# Patient Record
Sex: Female | Born: 1990 | Hispanic: Yes | Marital: Married | Smoking: Never smoker
Health system: Southern US, Community
[De-identification: ages and names within clinical notes are randomized; demographics above are authoritative.]

## PROBLEM LIST (undated history)

## (undated) DIAGNOSIS — N979 Female infertility, unspecified: Secondary | ICD-10-CM

## (undated) DIAGNOSIS — E88819 Insulin resistance, unspecified: Secondary | ICD-10-CM

## (undated) DIAGNOSIS — N83209 Unspecified ovarian cyst, unspecified side: Secondary | ICD-10-CM

## (undated) DIAGNOSIS — O3480 Maternal care for other abnormalities of pelvic organs, unspecified trimester: Secondary | ICD-10-CM

## (undated) DIAGNOSIS — D649 Anemia, unspecified: Secondary | ICD-10-CM

## (undated) DIAGNOSIS — N760 Acute vaginitis: Secondary | ICD-10-CM

## (undated) DIAGNOSIS — B9689 Other specified bacterial agents as the cause of diseases classified elsewhere: Secondary | ICD-10-CM

## (undated) HISTORY — DX: Female infertility, unspecified: N97.9

## (undated) HISTORY — DX: Other specified bacterial agents as the cause of diseases classified elsewhere: N76.0

## (undated) HISTORY — PX: NO PAST SURGERIES: SHX2092

## (undated) HISTORY — DX: Other specified bacterial agents as the cause of diseases classified elsewhere: B96.89

---
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
## 2013-04-12 ENCOUNTER — Emergency Department: Payer: Self-pay | Admitting: Emergency Medicine

## 2013-04-12 LAB — URINALYSIS, COMPLETE
Bacteria: NONE SEEN
Glucose,UR: NEGATIVE mg/dL (ref 0–75)
Ketone: NEGATIVE
Leukocyte Esterase: NEGATIVE
Protein: 25
RBC,UR: 2 /HPF (ref 0–5)
Squamous Epithelial: 4
WBC UR: 2 /HPF (ref 0–5)

## 2014-02-16 ENCOUNTER — Emergency Department: Payer: Self-pay | Admitting: Emergency Medicine

## 2014-02-16 LAB — URINALYSIS, COMPLETE
Bilirubin,UR: NEGATIVE
Blood: NEGATIVE
Glucose,UR: NEGATIVE mg/dL (ref 0–75)
Ketone: NEGATIVE
Nitrite: NEGATIVE
PROTEIN: NEGATIVE
Ph: 6 (ref 4.5–8.0)
SPECIFIC GRAVITY: 1.028 (ref 1.003–1.030)
WBC UR: 10 /HPF (ref 0–5)

## 2014-02-16 LAB — COMPREHENSIVE METABOLIC PANEL
ALK PHOS: 89 U/L
ANION GAP: 5 — AB (ref 7–16)
AST: 54 U/L — AB (ref 15–37)
Albumin: 4 g/dL (ref 3.4–5.0)
BUN: 8 mg/dL (ref 7–18)
Bilirubin,Total: 0.3 mg/dL (ref 0.2–1.0)
CHLORIDE: 106 mmol/L (ref 98–107)
CREATININE: 0.96 mg/dL (ref 0.60–1.30)
Calcium, Total: 8.7 mg/dL (ref 8.5–10.1)
Co2: 27 mmol/L (ref 21–32)
EGFR (African American): >60
GLUCOSE: 85 mg/dL (ref 65–99)
Osmolality: 273 (ref 275–301)
Potassium: 3.8 mmol/L (ref 3.5–5.1)
SGPT (ALT): 72 U/L (ref 12–78)
SODIUM: 138 mmol/L (ref 136–145)
Total Protein: 7.9 g/dL (ref 6.4–8.2)

## 2014-02-16 LAB — HCG, QUANTITATIVE, PREGNANCY: Beta Hcg, Quant.: 7185 m[IU]/mL — ABNORMAL HIGH

## 2014-02-16 LAB — CBC
HCT: 37.3 % (ref 35.0–47.0)
HGB: 12.3 g/dL (ref 12.0–16.0)
MCH: 26 pg (ref 26.0–34.0)
MCHC: 32.9 g/dL (ref 32.0–36.0)
MCV: 79 fL — AB (ref 80–100)
PLATELETS: 289 10*3/uL (ref 150–440)
RBC: 4.72 10*6/uL (ref 3.80–5.20)
RDW: 15.7 % — ABNORMAL HIGH (ref 11.5–14.5)
WBC: 15.3 10*3/uL — AB (ref 3.6–11.0)

## 2014-02-24 ENCOUNTER — Emergency Department: Payer: Self-pay | Admitting: Emergency Medicine

## 2014-02-24 LAB — URINALYSIS, COMPLETE
Bacteria: NONE SEEN
Bilirubin,UR: NEGATIVE
GLUCOSE, UR: NEGATIVE mg/dL (ref 0–75)
Ketone: NEGATIVE
Nitrite: NEGATIVE
PH: 7 (ref 4.5–8.0)
PROTEIN: NEGATIVE
Specific Gravity: 1.006 (ref 1.003–1.030)
WBC UR: 5 /HPF (ref 0–5)

## 2014-02-24 LAB — CBC
HCT: 36.5 % (ref 35.0–47.0)
HGB: 12 g/dL (ref 12.0–16.0)
MCH: 26.2 pg (ref 26.0–34.0)
MCHC: 32.9 g/dL (ref 32.0–36.0)
MCV: 80 fL (ref 80–100)
Platelet: 284 10*3/uL (ref 150–440)
RBC: 4.59 10*6/uL (ref 3.80–5.20)
RDW: 16.3 % — AB (ref 11.5–14.5)
WBC: 15 10*3/uL — ABNORMAL HIGH (ref 3.6–11.0)

## 2014-02-24 LAB — HCG, QUANTITATIVE, PREGNANCY: Beta Hcg, Quant.: 49447 m[IU]/mL — ABNORMAL HIGH

## 2014-02-24 LAB — GC/CHLAMYDIA PROBE AMP

## 2014-02-24 LAB — WET PREP, GENITAL

## 2014-08-01 ENCOUNTER — Ambulatory Visit: Payer: Self-pay | Admitting: Advanced Practice Midwife

## 2014-09-19 ENCOUNTER — Observation Stay: Payer: Self-pay

## 2014-09-26 ENCOUNTER — Observation Stay: Payer: Self-pay

## 2014-10-03 ENCOUNTER — Observation Stay: Payer: Self-pay | Admitting: Obstetrics and Gynecology

## 2014-10-09 ENCOUNTER — Inpatient Hospital Stay: Payer: Self-pay | Admitting: Obstetrics and Gynecology

## 2014-12-15 LAB — SURGICAL PATHOLOGY

## 2016-07-13 ENCOUNTER — Emergency Department
Admission: EM | Admit: 2016-07-13 | Discharge: 2016-07-13 | Disposition: A | Discharge status: Home / Self Care | Payer: Self-pay | Attending: Emergency Medicine | Admitting: Emergency Medicine

## 2016-07-13 ENCOUNTER — Encounter: Payer: Self-pay | Admitting: Emergency Medicine

## 2016-07-13 DIAGNOSIS — L02212 Cutaneous abscess of back [any part, except buttock]: Secondary | ICD-10-CM | POA: Insufficient documentation

## 2016-07-13 MED ORDER — SULFAMETHOXAZOLE-TRIMETHOPRIM 800-160 MG PO TABS
1.0000 | ORAL_TABLET | Freq: Once | ORAL | Status: AC
  Administered 2016-07-13: 1 via ORAL
  Filled 2016-07-13: qty 1

## 2016-07-13 MED ORDER — LIDOCAINE-EPINEPHRINE (PF) 1 %-1:200000 IJ SOLN
30.0000 mL | Freq: Once | INTRAMUSCULAR | Status: AC
  Administered 2016-07-13: 30 mL via INTRADERMAL
  Filled 2016-07-13: qty 30

## 2016-07-13 MED ORDER — OXYCODONE-ACETAMINOPHEN 5-325 MG PO TABS
1.0000 | ORAL_TABLET | Freq: Once | ORAL | Status: AC
  Administered 2016-07-13: 1 via ORAL
  Filled 2016-07-13: qty 1

## 2016-07-13 MED ORDER — IBUPROFEN 600 MG PO TABS: 600.0000 mg | ORAL_TABLET | Freq: Four times a day (QID) | ORAL | 0 refills | Status: DC | PRN

## 2016-07-13 MED ORDER — OXYCODONE-ACETAMINOPHEN 5-325 MG PO TABS
1.0000 | ORAL_TABLET | Freq: Four times a day (QID) | ORAL | 0 refills | Status: DC | PRN
Start: 2016-07-13 — End: 2018-08-10

## 2016-07-13 MED ORDER — SULFAMETHOXAZOLE-TRIMETHOPRIM 800-160 MG PO TABS: 1.0000 | ORAL_TABLET | Freq: Two times a day (BID) | ORAL | 0 refills | Status: DC

## 2016-07-13 NOTE — ED Provider Notes (Signed)
[facility] Provider Note   CSN: [id number] Arrival date & time: 07/13/16  2137     History   Chief Complaint Chief Complaint  Patient presents with  . Abscess    HPI Lauren Stephens is a 25 y.o. female presents to the respiratory for evaluation of abscess to her back. Abscess began 6 days ago and has continually become larger and causing more pain and discomfort. She denies any fevers, numbness tingling or radicular symptoms. Her pain is moderate to severe. She recently had ibuprofen with no improvement. Her pain is increased with touch and with lying on her back. She is able to ambulate with no assisted devices.  HPI  History reviewed. No pertinent past medical history.  There are no active problems to display for this patient.   History reviewed. No pertinent surgical history.  OB History    No data available       Home Medications    Prior to Admission medications   Medication Sig Start Date End Date Taking? Authorizing Provider  ibuprofen (ADVIL,MOTRIN) 600 MG tablet Take 1 tablet (600 mg total) by mouth every 6 (six) hours as needed for moderate pain. 07/13/16   Duanne Guess, PA-C  oxyCODONE-acetaminophen (ROXICET) 5-325 MG tablet Take 1 tablet by mouth every 6 (six) hours as needed for severe pain. 07/13/16   Duanne Guess, PA-C  sulfamethoxazole-trimethoprim (BACTRIM DS,SEPTRA DS) 800-160 MG tablet Take 1 tablet by mouth 2 (two) times daily. 07/13/16   Duanne Guess, PA-C    Family History History reviewed. No pertinent family history.  Social History Social History  Substance Use Topics  . Smoking status: Never Smoker  . Smokeless tobacco: Never Used  . Alcohol use No     Allergies   Patient has no known allergies.   Review of Systems Review of Systems  Constitutional: Negative for chills and fever.  HENT: Negative for ear pain and sore throat.   Eyes: Negative for pain and visual disturbance.  Respiratory:  Negative for cough and shortness of breath.   Cardiovascular: Negative for chest pain and palpitations.  Gastrointestinal: Negative for abdominal pain and vomiting.  Genitourinary: Negative for dysuria and hematuria.  Musculoskeletal: Negative for arthralgias and back pain.  Skin: Positive for wound. Negative for color change and rash.  Neurological: Negative for seizures and syncope.  All other systems reviewed and are negative.    Physical Exam Updated Vital Signs BP (!) 142/83 (BP Location: Left Arm)   Pulse 80   Temp 98.7 F (37.1 C) (Oral)   Resp 16   Ht 5' (1.524 m)   Wt 95.3 kg   SpO2 100%   BMI 41.01 kg/m   Physical Exam  Constitutional: She is oriented to person, place, and time. She appears well-developed and well-nourished. No distress.  HENT:  Head: Normocephalic and atraumatic.  Mouth/Throat: Oropharynx is clear and moist.  Eyes: EOM are normal. Pupils are equal, round, and reactive to light. Right eye exhibits no discharge. Left eye exhibits no discharge.  Neck: Normal range of motion. Neck supple.  Cardiovascular: Normal rate, regular rhythm, normal heart sounds and intact distal pulses.   Pulmonary/Chest: Effort normal and breath sounds normal. No respiratory distress. She exhibits no tenderness.  Abdominal: Soft. She exhibits no distension. There is no tenderness.  Musculoskeletal: Normal range of motion. She exhibits no edema.  Patient has localized tenderness with induration and mild fluctuants along the upper lumbar spine along the spinous process. Area is red  with surrounding erythema approximately 6 cm in diameter. She is moderately tender to palpation. She has no other tenderness throughout the lumbar spine. She has full range of motion of the lower extremities with no discomfort ambulates and nontoxic component.  Neurological: She is alert and oriented to person, place, and time. She has normal reflexes.  Skin: Skin is warm and dry.  Psychiatric: She has  a normal mood and affect. Her behavior is normal. Thought content normal.     ED Treatments / Results  Labs (all labs ordered are listed, but only abnormal results are displayed) Labs Reviewed - No data to display  EKG  EKG Interpretation None       Radiology No results found.  Procedures Procedures (including critical care time) INCISION AND DRAINAGE Performed by: Feliberto Gottron Consent: Verbal consent obtained. Risks and benefits: risks, benefits and alternatives were discussed Type: abscess  Body area: Lower back  Anesthesia: local infiltration  Incision was made with a scalpel.  Local anesthetic: lidocaine 1 % with epinephrine  Anesthetic total: 5 ml  Complexity: complex Blunt dissection to break up loculations  Drainage: purulent  Drainage amount: 6 cc purulent drainage   Packing material: 1/4 in iodoform gauze  Patient tolerance: Patient tolerated the procedure well with no immediate complications.    Medications Ordered in ED Medications  lidocaine-EPINEPHrine (XYLOCAINE-EPINEPHrine) 1 %-1:200000 (PF) injection 30 mL (not administered)  oxyCODONE-acetaminophen (PERCOCET/ROXICET) 5-325 MG per tablet 1 tablet (1 tablet Oral Given 07/13/16 2254)  sulfamethoxazole-trimethoprim (BACTRIM DS,SEPTRA DS) 800-160 MG per tablet 1 tablet (1 tablet Oral Given 07/13/16 2254)     Initial Impression / Assessment and Plan / ED Course  I have reviewed the triage vital signs and the nursing notes.  Pertinent labs & imaging results that were available during my care of the patient were reviewed by me and considered in my medical decision making (see chart for details).  Clinical Course     25 year old female with lower back abscess, has no systemic signs of infection and no radicular symptoms. She saw improvement with incision and drainage along with packing. She will follow-up in 2-3 days for recheck, packing removal and possible wound  repacking.Return sooner for any worsening symptoms urgent changes in her health.  Final Clinical Impressions(s) / ED Diagnoses   Final diagnoses:  Abscess of back    New Prescriptions New Prescriptions   IBUPROFEN (ADVIL,MOTRIN) 600 MG TABLET    Take 1 tablet (600 mg total) by mouth every 6 (six) hours as needed for moderate pain.   OXYCODONE-ACETAMINOPHEN (ROXICET) 5-325 MG TABLET    Take 1 tablet by mouth every 6 (six) hours as needed for severe pain.   SULFAMETHOXAZOLE-TRIMETHOPRIM (BACTRIM DS,SEPTRA DS) 800-160 MG TABLET    Take 1 tablet by mouth 2 (two) times daily.     Duanne Guess, PA-C 07/13/16 2327    Nena Polio, MD 07/15/16 [phone]

## 2016-07-13 NOTE — ED Triage Notes (Signed)
Pt to triage in [facility], report abscess to mid back.

## 2016-07-13 NOTE — Discharge Instructions (Signed)
Please follow-up with primary care physician or emergency department in 2-3 days for recheck and packing removal. Take medications as prescribed. Return to the ER for any fevers, worsening symptoms or urgent changes in her health.

## 2016-07-22 ENCOUNTER — Emergency Department
Admission: EM | Admit: 2016-07-22 | Discharge: 2016-07-22 | Disposition: A | Discharge status: Home / Self Care | Payer: Self-pay | Attending: Emergency Medicine | Admitting: Emergency Medicine

## 2016-07-22 DIAGNOSIS — Z791 Long term (current) use of non-steroidal anti-inflammatories (NSAID): Secondary | ICD-10-CM | POA: Insufficient documentation

## 2016-07-22 DIAGNOSIS — Z5189 Encounter for other specified aftercare: Secondary | ICD-10-CM

## 2016-07-22 DIAGNOSIS — L02212 Cutaneous abscess of back [any part, except buttock]: Secondary | ICD-10-CM | POA: Insufficient documentation

## 2016-07-22 NOTE — ED Provider Notes (Signed)
[facility] Emergency Department Provider Note  ____________________________________________   First MD Initiated Contact with Patient 07/22/16 1240     (approximate)  I have reviewed the triage vital signs and the nursing notes.   HISTORY  Chief Complaint Wound Check   HPI Lauren Stephens is a 25 y.o. female is here for recheck of her abscessed area on her back. Patient states that she took all the antibiotics until completely finished. She states that area is not draining anymore but that her family member and she had been cleaning the area with alcohol and peroxide. She is concerned that the area is still open and is concerned that she "needs stitches". She denies any pain.   History reviewed. No pertinent past medical history.  There are no active problems to display for this patient.   History reviewed. No pertinent surgical history.  Prior to Admission medications   Medication Sig Start Date End Date Taking? Authorizing Provider  ibuprofen (ADVIL,MOTRIN) 600 MG tablet Take 1 tablet (600 mg total) by mouth every 6 (six) hours as needed for moderate pain. 07/13/16   Duanne Guess, PA-C  oxyCODONE-acetaminophen (ROXICET) 5-325 MG tablet Take 1 tablet by mouth every 6 (six) hours as needed for severe pain. 07/13/16   Duanne Guess, PA-C  sulfamethoxazole-trimethoprim (BACTRIM DS,SEPTRA DS) 800-160 MG tablet Take 1 tablet by mouth 2 (two) times daily. 07/13/16   Duanne Guess, PA-C    Allergies Patient has no known allergies.  No family history on file.  Social History Social History  Substance Use Topics  . Smoking status: Never Smoker  . Smokeless tobacco: Never Used  . Alcohol use No    Review of Systems Constitutional: No fever/chills Cardiovascular: Denies chest pain. Respiratory: Denies shortness of breath. Gastrointestinal:   No nausea, no vomiting.   Musculoskeletal: Negative for back pain. Skin:Positive for  healing abscess. Neurological: Negative for focal weakness or numbness.  10-point ROS otherwise negative.  ____________________________________________   PHYSICAL EXAM:  VITAL SIGNS: ED Triage Vitals [07/22/16 1223]  Enc Vitals Group     BP 123/66     Pulse Rate 85     Resp 16     Temp 99 F (37.2 C)     Temp Source Oral     SpO2 99 %     Weight      Height      Head Circumference      Peak Flow      Pain Score      Pain Loc      Pain Edu?      Excl. in [place]?     Constitutional: Alert and oriented. Well appearing and in no acute distress. Eyes: Conjunctivae are normal. PERRL. EOMI. Head: Atraumatic. Nose: No congestion/rhinnorhea. Neck: No stridor.   Cardiovascular: Normal rate, regular rhythm. Grossly normal heart sounds.  Good peripheral circulation. Respiratory: Normal respiratory effort.  No retractions. Lungs CTAB. Musculoskeletal: Moves upper and lower extremities without any difficulty. Normal gait was noted. Neurologic:  Normal speech and language. No gross focal neurologic deficits are appreciated. No gait instability. Skin:  Skin is warm, dry.  At the site of abscess area is still open without active drainage. No signs of infection seen. Psychiatric: Mood and affect are normal. Speech and behavior are normal.  ____________________________________________   LABS (all labs ordered are listed, but only abnormal results are displayed)  Labs Reviewed - No data to display ____________________________________________   PROCEDURES  Procedure(s)  performed: None  Procedures  Critical Care performed: No  ____________________________________________   INITIAL IMPRESSION / ASSESSMENT AND PLAN / ED COURSE  Pertinent labs & imaging results that were available during my care of the patient were reviewed by me and considered in my medical decision making (see chart for details).    Clinical Course    Patient was told to stop using alcohol and peroxide to  clean this area. She is to use mild soap and water once a day and allow area to dry. Band-Aid was placed prior to discharge. Patient is follow-up with [facility] clinic if any continued problems.  ____________________________________________   FINAL CLINICAL IMPRESSION(S) / ED DIAGNOSES  Final diagnoses:  Wound check, abscess      NEW MEDICATIONS STARTED DURING THIS VISIT:  Discharge Medication List as of 07/22/2016 12:51 PM       Note:  This document was prepared using Dragon voice recognition software and may include unintentional dictation errors.    Johnn Hai, PA-C 07/22/16 [place], MD 07/24/16 [phone]

## 2016-07-22 NOTE — ED Notes (Signed)
Bandage placed on patient's back over wound.  Patient given instructions on how to clean wound and to stop using hydrogen peroxide and alcohol.  Patient verbalized understanding of instructions and has no further questions.

## 2016-07-22 NOTE — ED Triage Notes (Signed)
Pt states she has an abscess to her back that is not draining anymore. Pt states she has a "hole" where abscess was. Concerned she needs stitches.

## 2016-07-22 NOTE — Discharge Instructions (Signed)
Follow-up with [facility] clinic if any continued problems. Clean area daily with mild soap and water. Cover with a Band-Aid but allow area to get air when at home.  Dry completely before putting next bandaid on

## 2016-11-17 LAB — HM PAP SMEAR: HM Pap smear: NEGATIVE

## 2017-08-22 NOTE — L&D Delivery Note (Signed)
Delivery Note   CNM called to room for decels, pt with rapid cervical change and BBOW with SROM copious amt clear fluid. CNM arrived in room approx 58min after phone call, examined and found to be C/C/+2, FSE applied and FHR 90s with return to baseline 110bpm between UCs.    First Stage: Labor onset: approx 0600, pt started feeling discomfort and having variable decels Induction: Cytotec x 1, Pitocin Analgesia /Anesthesia intrapartum: epidural SROM at 0625   Second Stage: Complete dilation at 0630 Onset of pushing at 0634 FHR second stage: Cat II, variable decels to 60s  Delivery of a viable female on 08/08/2018 at 0637  by Hassan Buckler CNM delivery of fetal head in LOA position with restitution to LOT, tight nuchal cord noted x 2,  Anterior then posterior shoulders delivered easily with gentle downward traction.  Baby vigorous and crying, placed on mom's chest skin-to-skin, and attended to by peds. Cord double clamped after cessation of pulsation, cut by FOB Cord blood sample collected.  Third Stage: Placenta delivered spontaneously intact with 3VC @ [phone] with large gush of bright red bleeding and clots. Fundus was firm with scant lochia.  Placenta disposition: to pathology  NO lacerations identified, intact cervix, vagina and perineum.    Est. Blood Loss (mL): 606  Complications: none  Mom to postpartum.  Baby to Couplet care / Skin to Skin.  Newborn: Birth Weight: pending  Apgar Scores: 8/9 Feeding planned: breast and formula

## 2018-01-18 ENCOUNTER — Other Ambulatory Visit: Payer: Self-pay | Admitting: Advanced Practice Midwife

## 2018-01-18 DIAGNOSIS — Z369 Encounter for antenatal screening, unspecified: Secondary | ICD-10-CM

## 2018-02-05 DIAGNOSIS — E8881 Metabolic syndrome: Secondary | ICD-10-CM | POA: Insufficient documentation

## 2018-02-08 ENCOUNTER — Encounter: Payer: Self-pay | Admitting: *Deleted

## 2018-02-08 ENCOUNTER — Ambulatory Visit
Admission: RE | Admit: 2018-02-08 | Discharge: 2018-02-08 | Disposition: A | Discharge status: Home / Self Care | Payer: Medicaid Other | Source: Ambulatory Visit | Attending: Maternal and Fetal Medicine | Admitting: Maternal and Fetal Medicine

## 2018-02-08 ENCOUNTER — Ambulatory Visit (HOSPITAL_BASED_OUTPATIENT_CLINIC_OR_DEPARTMENT_OTHER)
Admission: RE | Admit: 2018-02-08 | Discharge: 2018-02-08 | Disposition: A | Discharge status: Home / Self Care | Payer: Medicaid Other | Source: Ambulatory Visit | Attending: Maternal and Fetal Medicine | Admitting: Maternal and Fetal Medicine

## 2018-02-08 VITALS — BP 139/80 | HR 82 | Temp 98.5°F | Resp 18 | Wt 223.0 lb

## 2018-02-08 DIAGNOSIS — Z3149 Encounter for other procreative investigation and testing: Secondary | ICD-10-CM | POA: Diagnosis not present

## 2018-02-08 DIAGNOSIS — Z3A12 12 weeks gestation of pregnancy: Secondary | ICD-10-CM | POA: Diagnosis not present

## 2018-02-08 DIAGNOSIS — Z369 Encounter for antenatal screening, unspecified: Secondary | ICD-10-CM

## 2018-02-08 DIAGNOSIS — Z3689 Encounter for other specified antenatal screening: Secondary | ICD-10-CM | POA: Insufficient documentation

## 2018-02-08 NOTE — Progress Notes (Signed)
Department, [place]* Length of Consultation: 30 minute consultation   [name]  was referred to [facility] for genetic counseling to review prenatal screening and testing options.  This note summarizes the information we discussed.    We offered the following routine screening tests for this pregnancy:  First trimester screening, which includes nuchal translucency ultrasound screen and first trimester maternal serum marker screening.  The nuchal translucency has approximately an 80% detection rate for Down syndrome and can be positive for other chromosome abnormalities as well as congenital heart defects.  When combined with a maternal serum marker screening, the detection rate is up to 90% for Down syndrome and up to 97% for trisomy 18.     Maternal serum marker screening, a blood test that measures pregnancy proteins, can provide risk assessments for Down syndrome, trisomy 18, and open neural tube defects (spina bifida, anencephaly). Because it does not directly examine the fetus, it cannot positively diagnose or rule out these problems.  Targeted ultrasound uses high frequency sound waves to create an image of the developing fetus.  An ultrasound is often recommended as a routine means of evaluating the pregnancy.  It is also used to screen for fetal anatomy problems (for example, a heart defect) that might be suggestive of a chromosomal or other abnormality.   Should these screening tests indicate an increased concern, then the following additional testing options would be offered:  The chorionic villus sampling procedure is available for first trimester chromosome analysis.  This involves the withdrawal of a small amount of chorionic villi (tissue from the developing placenta).  Risk of pregnancy loss is estimated to be approximately 1 in 200 to 1 in 100 (0.5 to 1%).  There is approximately a 1% (1 in 100) chance that the CVS chromosome results will  be unclear.  Chorionic villi cannot be tested for neural tube defects.     Amniocentesis involves the removal of a small amount of amniotic fluid from the sac surrounding the fetus with the use of a thin needle inserted through the maternal abdomen and uterus.  Ultrasound guidance is used throughout the procedure.  Fetal cells from amniotic fluid are directly evaluated and > 99.5% of chromosome problems and > 98% of open neural tube defects can be detected. This procedure is generally performed after the 15th week of pregnancy.  The main risks to this procedure include complications leading to miscarriage in less than 1 in 200 cases (0.5%).  As another option for information if the pregnancy is suspected to be an an increased chance for certain chromosome conditions, we also reviewed the availability of cell free fetal DNA testing from maternal blood to determine whether or not the baby may have either Down syndrome, trisomy 33, or trisomy 25.  This test utilizes a maternal blood sample and DNA sequencing technology to isolate circulating cell free fetal DNA from maternal plasma.  The fetal DNA can then be analyzed for DNA sequences that are derived from the three most common chromosomes involved in aneuploidy, chromosomes 13, 18, and 21.  If the overall amount of DNA is greater than the expected level for any of these chromosomes, aneuploidy is suspected.  While we do not consider it a replacement for invasive testing and karyotype analysis, a negative result from this testing would be reassuring, though not a guarantee of a normal chromosome complement for the baby.  An abnormal result is certainly suggestive of an abnormal chromosome complement, though we  would still recommend CVS or amniocentesis to confirm any findings from this testing.  Cystic Fibrosis and Spinal Muscular Atrophy (SMA) screening were also discussed with the patient. Both conditions are recessive, which means that both parents must be  carriers in order to have a child with the disease.  Cystic fibrosis (CF) is one of the most common genetic conditions in persons of Caucasian ancestry.  This condition occurs in approximately 1 in 2,500 Caucasian persons and results in thickened secretions in the lungs, digestive, and reproductive systems.  For a baby to be at risk for having CF, both of the parents must be carriers for this condition.  Approximately 1 in 102 Caucasian persons is a carrier for CF.  Current carrier testing looks for the most common mutations in the gene for CF and can detect approximately 90% of carriers in the Caucasian population.  This means that the carrier screening can greatly reduce, but cannot eliminate, the chance for an individual to have a child with CF.  If an individual is found to be a carrier for CF, then carrier testing would be available for the partner. As part of [place] newborn screening profile, all babies born in the state of [place] will have a two-tier screening process.  Specimens are first tested to determine the concentration of immunoreactive trypsinogen (IRT).  The top 5% of specimens with the highest IRT values then undergo DNA testing using a panel of over 40 common CF mutations. SMA is a neurodegenerative disorder that leads to atrophy of skeletal muscle and overall weakness.  This condition is also more prevalent in the Caucasian population, with 1 in 40-1 in 60 persons being a carrier and 1 in 6,000-1 in 10,000 children being affected.  There are multiple forms of the disease, with some causing death in infancy to other forms with survival into adulthood.  The genetics of SMA is complex, but carrier screening can detect up to 95% of carriers in the Caucasian population.  Similar to CF, a negative result can greatly reduce, but cannot eliminate, the chance to have a child with SMA.  We obtained a detailed family history and pregnancy history.  The remainder of the family history was  reported to be unremarkable for birth defects, intellectual delays, recurrent pregnancy loss or known chromosome abnormalities.  Consanguinity is denied.  The patient and her husband are of [place] and [place] ancestry, respectively.  A hemoglobin electrophoresis is unavailable and, if one has not been performed, would recommend one be offered as hemoglobin traits can occur with a higher than expected risk within these populations.  [name] reported no pregnancy complications or exposures.  After consideration of the options, [name] elected to proceed with First Trimester Screening.  An ultrasound was performed at the time of the visit.   Fetal anatomy could not be assessed due to early gestational age.  Please refer to the ultrasound report for details of that study.  [name] was encouraged to call with questions or concerns.  We can be contacted at [phone].  Labs ordered: FIRST TRIMESTER SCREENING  Case discussed with me. Ricardo Jericho MD

## 2018-02-12 ENCOUNTER — Telehealth: Payer: Self-pay | Admitting: Genetics

## 2018-02-12 NOTE — Telephone Encounter (Signed)
[name]  elected to undergo First Trimester screening on 02/08/18.  To review, first trimester screening, includes nuchal translucency ultrasound screen and/or first trimester maternal serum marker screening.  The nuchal translucency has approximately an 80% detection rate for Down syndrome and can be positive for other chromosome abnormalities as well as heart defects.  When combined with a maternal serum marker screening, the detection rate is up to 90% for Down syndrome and up to 97% for trisomy 13 and 18.     The results of the First Trimester Nuchal Translucency and Biochemical Screening were within normal range.  The risk for Down syndrome is now estimated to be 1 in 10,000.  The risk for Trisomy 13/18 is 1 in 10,000.  Should more definitive information be desired, we would offer amniocentesis.  Because we do not yet know the effectiveness of combined first and second trimester screening, we do not recommend a maternal serum screen to assess the chance for chromosome conditions.  However, if screening for neural tube defects is desired, maternal serum screening for AFP only can be performed between 15 and [redacted] weeks gestation.     Margorie John, MS, [place]

## 2018-05-21 ENCOUNTER — Encounter: Payer: Self-pay | Admitting: Physical Therapy

## 2018-05-21 ENCOUNTER — Other Ambulatory Visit: Payer: Self-pay

## 2018-05-21 ENCOUNTER — Ambulatory Visit: Payer: Medicaid Other | Attending: Advanced Practice Midwife | Admitting: Physical Therapy

## 2018-05-21 DIAGNOSIS — M25552 Pain in left hip: Secondary | ICD-10-CM | POA: Diagnosis present

## 2018-05-21 NOTE — Patient Instructions (Addendum)
Access Code: [email or web address]  URL: [email or web address]  Date: 05/21/2018  Prepared by: Blanche East   Exercises  Seated Figure 4 Piriformis Stretch - 5 reps - 1 sets - 30 hold - 3x daily - 7x weekly  Seated Piriformis Stretch - 5 reps - 1 sets - 30 hold - 3x daily - 7x weekly  Sidelying Hip Abduction - 15 reps - 2 sets - 2x daily - 7x weekly

## 2018-05-21 NOTE — Therapy (Addendum)
[place] MAIN [facility] SERVICES [address] [place], [place], [address] Phone: [phone]   Fax:  [phone]  Physical Therapy Evaluation  Patient Details  Name: Lauren Stephens MRN: [id number] Date of Birth: April 27, 1991 Referring Provider (PT): Donnal Moat   Encounter Date: 05/21/2018  PT End of Session - 05/22/18 1000    Visit Number  1    Number of Visits  9    Date for PT Re-Evaluation  06/19/18    Authorization Type  progress note 1/10; eval 05/21/18    PT Start Time  0430    PT Stop Time  0530    PT Time Calculation (min)  60 min    Activity Tolerance  Patient tolerated treatment well;No increased pain    Behavior During Therapy  [facility] for tasks assessed/performed         History reviewed. No pertinent past medical history.  History reviewed. No pertinent surgical history.  There were no vitals filed for this visit.   Subjective Assessment - 05/21/18 1642    Subjective  Patient reports she slipped and fell outside and hit her back on concrete steps at end of last pregnancy. Pt reports feeling pain in L posterior hip as belly has grown with second pregnancy; notices most at night when laying down and rolling over in bed. Pt reports she is unable to lay on L side when hip pain is increased.     Pertinent History  Pt is a 27 yo female with history of fall at end of last pregnancy about 3 years ago; pain resolved after giving birth 3 years ago. Pt's pain has returned with second pregnancy, due date 08/19/2018; most pain in posterior L hip and some pain in L heel when stepping. Pt has pain from L heel that radiates up to posterior hip; does have pain that radiates in L buttock.     Limitations  Standing;Walking;Lifting;Other (comment)   Kneeling down and standing back up   How long can you sit comfortably?  N/A    How long can you stand comfortably?  Increases pain after 20-30 min of prolonged standing    How long can you  walk comfortably?  Increases pain after 20-30 min of walking/cleaning    Diagnostic tests  None    Patient Stated Goals  "Decrease pain and not have future health problems"    Currently in Pain?  Yes    Pain Score  2     Pain Location  Hip    Pain Orientation  Posterior;Left    Pain Descriptors / Indicators  Tightness;Aching    Pain Type  Chronic pain    Pain Radiating Towards  Low back along spine, some pain into L buttock     Pain Onset  More than a month ago    Pain Frequency  Constant    Aggravating Factors   standing/walking for a long time, grocery shopping    Pain Relieving Factors  icy hot, self massage    Effect of Pain on Daily Activities  decreased activity, trouble sleeping    Multiple Pain Sites  No         [facility] PT Assessment - 05/21/18 0001      Assessment   Medical Diagnosis  L hip pain    Referring Provider (PT)  Donnal Moat    Onset Date/Surgical Date  10/20/17    Hand Dominance  Right    Next MD Visit  05/30/2018  Prior Therapy  None      Precautions   Precautions  None      Restrictions   Weight Bearing Restrictions  No      Balance Screen   Has the patient fallen in the past 6 months  No    Has the patient had a decrease in activity level because of a fear of falling?   Yes    Is the patient reluctant to leave their home because of a fear of falling?   No      Home Film/video editor residence    Living Arrangements  Spouse/significant other;Children    Available Help at Discharge  Family    Type of [place] to enter    Entrance Stairs-Number of Steps  2    Entrance Stairs-Rails  Can reach both    [place]  One level    [place]  None      Prior Function   Level of Independence  Independent    Vocation  Unemployed    Leisure  Play with 41 year old son, going to park      Cognition   Overall Cognitive Status  Within Functional Limits for tasks assessed      Sensation   Light  Touch  Appears Intact      Coordination   Gross Motor Movements are Fluid and Coordinated  Yes    Fine Motor Movements are Fluid and Coordinated  Yes      Strength   Overall Strength  Within functional limits for tasks performed    Overall Strength Comments  some increased pain with prolonged L hip flexion in sitting      Special Tests    Special Tests  [place] Tests    Other special tests  - Gilet's sign, + Tredelenburg      Transfers   Transfers  Independent with all Transfers      Ambulation/Gait   Gait Comments  Trendelenburg stance and gait on L side                Objective measurements completed on examination: See above findings.   Instructed patient in HEP: (x8 min) Performed seated figure 4 piriformis stretch x20 sec holds x2 reps on L; required VCs for proper positioning and technique Performed modified seated piriformis stretch with L knee towards R shoulder x20 sec holds x2 reps; required VCs for proper positioning   Provided written handout for better adherence;        Plan - 05/22/18 1001    Clinical Impression Statement  Lauren Stephens is a 27 yo female presenting with chief complaint of L posterior hip pain that began about 6-7 months ago when she became pregnant with her second child. Pt presents with increased tenderness along L glut med and piriformis musculature; increased tightness noted in piriformis and L glut med as well. Pt presents with weakness in L hip abduction evidenced by Trendelenburg sign in standing as well as during ambulation. Pt presented with negative Gilet's sign and no pain along SIJ. Pt would benefit from skilled PT intervention for improvements in L hip pain and mobility.    History and Personal Factors relevant to plan of care:  (+) age, motivated, no recent fall history (-) recurrent pain with pregnancy, severe pain in evening    Clinical Presentation  Evolving    Clinical Presentation due to:  recurrent pain with  pregnancy,  severe pain in evening, increased symptoms associated with pregnancy    Clinical Decision Making  Moderate    Rehab Potential  Good    Clinical Impairments Affecting Rehab Potential  (+) age, motivated, no recent falls (-) recurrent pain with pregnancy, severe pain at night    PT Frequency  2x / week    PT Duration  4 weeks    PT Treatment/Interventions  Cryotherapy;Electrical Stimulation;Moist Heat;Traction;Ultrasound;Gait training;Functional mobility Scientist, forensic;Therapeutic activities;Therapeutic exercise;Manual techniques;Passive range of motion;Dry needling;Taping    PT Next Visit Plan  pain relief, stretching    PT Home Exercise Plan  seated piriformis stretch, sidelying hip abduction    Consulted and Agree with Plan of Care  Patient       PT Short Term Goals - 05/22/18 1010      PT SHORT TERM GOAL #1   Title  Patient will be independent in home exercise program to improve strength/mobility for better functional independence with ADLs.    Baseline  05/21/18: seated piriformis stretch, sidelying hip abduction given    Time  2    Period  Weeks    Status  New    Target Date  06/05/18      PT Long Term Goals - 05/22/18 1010      PT LONG TERM GOAL #1   Title  Pt will improve LEFS score to >60/80 to indicate improved function and ADL performance with decreased pain.    Baseline  05/21/18: 49/80    Time  4    Period  Weeks    Status  New    Target Date  06/19/18      PT LONG TERM GOAL #2   Title  Patient will report no greater than 6/10 pain in L hip at night to indicate improved mobility and sleep quality for better functional performance in ADLs.    Baseline  05/21/18: 8/10    Time  4    Period  Weeks    Status  New    Target Date  06/19/18      PT LONG TERM GOAL #3   Title  Patient will improve L hip abduction score to at least 4/5 to improve functional strength for prolonged standing, walking, and grocery shopping.     Baseline  05/21/18: 3+/5    Time  4     Period  Weeks    Status  New    Target Date  06/19/18                            Patient will benefit from skilled therapeutic intervention in order to improve the following deficits and impairments:     Visit Diagnosis: Pain in left hip     Problem List Patient Active Problem List   Diagnosis Date Noted  . Procreative investigation and testing 02/08/2018   Harriet Masson SPT This entire session was performed under direct supervision and direction of a licensed therapist/therapist assistant . I have personally read, edited and approve of the note as written.  Trotter,Margaret PT, DPT 05/21/2018, 5:28 PM  [place] MAIN [facility] SERVICES [address] [place], [place], [address] Phone: [phone]   Fax:  [phone]  Name: Annalynne Ibanez MRN: [id number] Date of Birth: 08/18/1991

## 2018-05-29 ENCOUNTER — Ambulatory Visit: Payer: Medicaid Other | Admitting: Physical Therapy

## 2018-05-30 ENCOUNTER — Encounter: Payer: Self-pay | Admitting: Physical Therapy

## 2018-05-30 ENCOUNTER — Ambulatory Visit: Payer: Medicaid Other | Attending: Advanced Practice Midwife | Admitting: Physical Therapy

## 2018-05-30 DIAGNOSIS — M25552 Pain in left hip: Secondary | ICD-10-CM | POA: Diagnosis present

## 2018-05-30 LAB — HM HIV SCREENING LAB: HM HIV Screening: NEGATIVE

## 2018-05-30 NOTE — Therapy (Addendum)
[place] MAIN [facility] SERVICES [address] [place], [place], [address] Phone: [phone]   Fax:  [phone]  Physical Therapy Treatment  Patient Details  Name: Lauren Stephens MRN: [id number] Date of Birth: January 28, 1991 Referring Provider (PT): Donnal Moat   Encounter Date: 05/30/2018  PT End of Session - 05/30/18 1248    Visit Number  2    Number of Visits  9    Date for PT Re-Evaluation  06/19/18    Authorization Type  progress note 2/10; eval 05/21/18    PT Start Time  1300    PT Stop Time  1345    PT Time Calculation (min)  45 min    Activity Tolerance  Patient tolerated treatment well;No increased pain    Behavior During Therapy  [facility] for tasks assessed/performed       History reviewed. No pertinent past medical history.  History reviewed. No pertinent surgical history.  There were no vitals filed for this visit.  Subjective Assessment - 05/30/18 1351    Subjective  Patient reports she is doing okay today; had very intense pain in L hip and some in the R hip last night. Pt believes it was related to grocery shopping and carrying all heavy grocery bags into the house.     Pertinent History  Pt is a 27 yo female with history of fall at end of last pregnancy about 3 years ago; pain resolved after giving birth 3 years ago. Pt's pain has returned with second pregnancy, due date 08/19/2018; most pain in posterior L hip and some pain in L heel when stepping. Pt has pain from L heel that radiates up to posterior hip; does have pain that radiates in L buttock.     Limitations  Standing;Walking;Lifting;Other (comment)   Kneeling down and standing back up   How long can you sit comfortably?  N/A    How long can you stand comfortably?  Increases pain after 20-30 min of prolonged standing    How long can you walk comfortably?  Increases pain after 20-30 min of walking/cleaning    Diagnostic tests  None    Patient Stated Goals   "Decrease pain and not have future health problems"    Currently in Pain?  No/denies          Treatment Seated HS stretch with towel x20 sec holds x2 reps on L leg and 1 rep on R leg Seated figure 4 piriformis stretch x30 sec holds x2 reps on L only Seated figure 4 modified piriformis stretch x30 sec holds x2 reps on L only -Required VCs and demonstration for proper technique and positioning for best stretch   Sidelying -Hip ABD x3 reps on L; pt reported increased pain and exercise was discontinued -Clamshells x2 reps on L; pt reported continued increased pain with greater pain than hip ABD with straight leg  Manual therapy: -Rolling stick to glut muscles and piriformis x25 min, increased pressure in piriformis area due to pt report of tenderness to this area; some increased soreness noted with deep pressure but improved after 2 min; decreased tightness noted following rolling  Pt reported improved feeling of mobility and more relaxed following rolling; educated pt on being able to purchase rolling stick online for husband to use at home                  PT Education - 05/30/18 1247    Education Details  exercise technique/form, manual techniques  Person(s) Educated  Patient    Methods  Explanation;Demonstration;Verbal cues    Comprehension  Verbalized understanding;Returned demonstration;Verbal cues required;Need further instruction       PT Short Term Goals - 05/22/18 1010      PT SHORT TERM GOAL #1   Title  Patient will be independent in home exercise program to improve strength/mobility for better functional independence with ADLs.    Baseline  05/21/18: seated piriformis stretch, sidelying hip abduction given    Time  2    Period  Weeks    Status  New    Target Date  06/05/18        PT Long Term Goals - 05/22/18 1010      PT LONG TERM GOAL #1   Title  Pt will improve LEFS score to >60/80 to indicate improved function and ADL performance with  decreased pain.    Baseline  05/21/18: 49/80    Time  4    Period  Weeks    Status  New    Target Date  06/19/18      PT LONG TERM GOAL #2   Title  Patient will report no greater than 6/10 pain in L hip at night to indicate improved mobility and sleep quality for better functional performance in ADLs.    Baseline  05/21/18: 8/10    Time  4    Period  Weeks    Status  New    Target Date  06/19/18      PT LONG TERM GOAL #3   Title  Patient will improve L hip abduction score to at least 4/5 to improve functional strength for prolonged standing, walking, and grocery shopping.     Baseline  05/21/18: 3+/5    Time  4    Period  Weeks    Status  New    Target Date  06/19/18            Plan - 05/30/18 1356    Clinical Impression Statement  Patient tolerated thearpy session well. Pt continues to feel stretch in posterior hip with piriformis stretch, traditional and modified. Pt does report having difficulty performing stretch when hurting; advised pt to perform first thing in the morning and after any strenuous injury to try to help prevent increased muscle tightness and pain. Pt attempted hip abduction exercises in sidelying but reported increased pain with these movements; discontinued due to pain. PT performed manual therapy soft tissue massage with rolling stick to L posterior gluts specifically glut med and in the piriformis region. Tissue improved extensibility and demonstrated decreased tightness in muscles rolling. Pt verbalized feeling improved muscle movement and more relaxed in posterior hip following manual therapy. Pt will continue to benefit from skilled PT intervention for improvements in L hip pain and mobility.     Rehab Potential  Good    Clinical Impairments Affecting Rehab Potential  (+) age, motivated, no recent falls (-) recurrent pain with pregnancy, severe pain at night    PT Frequency  2x / week    PT Duration  4 weeks    PT Treatment/Interventions   Cryotherapy;Electrical Stimulation;Moist Heat;Traction;Ultrasound;Gait training;Functional mobility Scientist, forensic;Therapeutic activities;Therapeutic exercise;Manual techniques;Passive range of motion;Dry needling;Taping    PT Next Visit Plan  pain relief, stretching    PT Home Exercise Plan  seated piriformis stretch, sidelying hip abduction    Consulted and Agree with Plan of Care  Patient       Patient will benefit from skilled therapeutic intervention  in order to improve the following deficits and impairments:  Abnormal gait, Decreased mobility, Decreased strength, Pain  Visit Diagnosis: Pain in left hip     Problem List Patient Active Problem List   Diagnosis Date Noted  . Procreative investigation and testing 02/08/2018   Harriet Masson, SPT This entire session was performed under direct supervision and direction of a licensed therapist/therapist assistant . I have personally read, edited and approve of the note as written.  Trotter,Margaret PT, DPT 05/30/2018, 3:11 PM  [place] MAIN [facility] SERVICES [address] [place], [place], [address] Phone: [phone]   Fax:  [phone]  Name: Syla Devoss MRN: [id number] Date of Birth: 1990/09/23

## 2018-06-05 ENCOUNTER — Ambulatory Visit: Payer: Medicaid Other | Admitting: Physical Therapy

## 2018-06-05 ENCOUNTER — Encounter: Payer: Self-pay | Admitting: Physical Therapy

## 2018-06-05 DIAGNOSIS — M25552 Pain in left hip: Secondary | ICD-10-CM

## 2018-06-05 NOTE — Patient Instructions (Signed)
Access Code: [email or web address]  URL: [email or web address]  Date: 06/05/2018  Prepared by: Blanche East   Exercises  Seated Flexion Stretch with Swiss Ball - 2x daily - 7x weekly  Seated Hip Abduction with Resistance - 15 reps - 2 sets - 2x daily - 7x weekly  Sidelying Single Knee to Chest Stretch - 3 reps - 1 sets - 30 hold - 2x daily - 7x weekly

## 2018-06-05 NOTE — Therapy (Addendum)
[place] MAIN [facility] SERVICES [address] [place], [place], [address] Phone: [phone]   Fax:  [phone]  Physical Therapy Treatment  Patient Details  Name: Lauren Stephens MRN: [id number] Date of Birth: 1990/11/03 Referring Provider (PT): Donnal Moat   Encounter Date: 06/05/2018  PT End of Session - 06/05/18 1626    Visit Number  3    Number of Visits  9    Date for PT Re-Evaluation  06/19/18    Authorization Type  progress note 3/10; eval 05/21/18    PT Start Time  1530    PT Stop Time  1615    PT Time Calculation (min)  45 min    Activity Tolerance  Patient tolerated treatment well;No increased pain    Behavior During Therapy  [facility] for tasks assessed/performed       History reviewed. No pertinent past medical history.  History reviewed. No pertinent surgical history.  There were no vitals filed for this visit.  Subjective Assessment - 06/05/18 1538    Subjective  Patient reports she is doing okay today; continues to have increased pain at night. Pt did have improvement in pain following last session into the next day.    Pertinent History  Pt is a 27 yo female with history of fall at end of last pregnancy about 3 years ago; pain resolved after giving birth 3 years ago. Pt's pain has returned with second pregnancy, due date 08/19/2018; most pain in posterior L hip and some pain in L heel when stepping. Pt has pain from L heel that radiates up to posterior hip; does have pain that radiates in L buttock.     Limitations  Standing;Walking;Lifting;Other (comment)   Kneeling down and standing back up   How long can you sit comfortably?  N/A    How long can you stand comfortably?  Increases pain after 20-30 min of prolonged standing    How long can you walk comfortably?  Increases pain after 20-30 min of walking/cleaning    Diagnostic tests  None    Patient Stated Goals  "Decrease pain and not have future health problems"     Currently in Pain?  Yes    Pain Score  3     Pain Location  Hip    Pain Orientation  Posterior;Left    Pain Descriptors / Indicators  Aching;Tightness    Pain Type  Chronic pain    Pain Onset  More than a month ago    Pain Frequency  Intermittent    Aggravating Factors   prolonged standing/walking    Pain Relieving Factors  icy hot, self massage    Effect of Pain on Daily Activities  decreased activity, trouble getting up to go to the bathroom at night         Treatment Seated figure 4 piriformis stretch x30 sec holds x2 reps on L only Seated figure 4 modified piriformis stretch x30 sec holds x2 reps on L only -Required VCs and demonstration for proper technique and positioning for best stretch   Forward trunk flexion with blue physioball x2 min, VCs to widen legs for increase posterior hip stretch Lateral trunk flexion to each side with blue physioball x2 min with VCs for increasing stretch in opposite side  Seated hip abduction with red tband above knees x10 reps, VCs for proper technique and avoiding painful ROM   Sidelying, knee to chest x2 reps x15 sec holds, instructed pt to perform with  husband at home to improve ROM and flexibility in hip  Manual therapy: -Rolling stick to glut muscles and piriformis x15 min, increased pressure in piriformis area due to pt report of tenderness to this area; some increased soreness noted with deep pressure but improved after 2 min; decreased tightness noted following rolling  Pt reported improved feeling of mobility and more relaxed following rolling; reported 0/10 pain following session. Educated pt on advanced HEP (see patient instructions)                        PT Education - 06/05/18 1540    Education Details  exercise technique/form, manual techniques    Person(s) Educated  Patient    Methods  Explanation;Demonstration;Verbal cues    Comprehension  Verbalized understanding;Returned demonstration;Verbal cues  required;Need further instruction       PT Short Term Goals - 05/22/18 1010      PT SHORT TERM GOAL #1   Title  Patient will be independent in home exercise program to improve strength/mobility for better functional independence with ADLs.    Baseline  05/21/18: seated piriformis stretch, sidelying hip abduction given    Time  2    Period  Weeks    Status  New    Target Date  06/05/18        PT Long Term Goals - 05/22/18 1010      PT LONG TERM GOAL #1   Title  Pt will improve LEFS score to >60/80 to indicate improved function and ADL performance with decreased pain.    Baseline  05/21/18: 49/80    Time  4    Period  Weeks    Status  New    Target Date  06/19/18      PT LONG TERM GOAL #2   Title  Patient will report no greater than 6/10 pain in L hip at night to indicate improved mobility and sleep quality for better functional performance in ADLs.    Baseline  05/21/18: 8/10    Time  4    Period  Weeks    Status  New    Target Date  06/19/18      PT LONG TERM GOAL #3   Title  Patient will improve L hip abduction score to at least 4/5 to improve functional strength for prolonged standing, walking, and grocery shopping.     Baseline  05/21/18: 3+/5    Time  4    Period  Weeks    Status  New    Target Date  06/19/18            Plan - 06/05/18 1628    Clinical Impression Statement  Patient tolerated therapy session well. Pt continues to have pain in posterior hip specifically piriformis region. Progressed HEP to include forward and lateral trunk flexion with physioball to increase hip flexibility and tissue extensibility; VCs for proper technique and sequencing. PT performed rolling stick to glut muscles with increase focus on L piriformis secondary to pt complaints in this area. Pt reported no pain following manual therapy and feels improved mobility in L hip. Pt agreeable to continuing with HEP and adding in new stretches as well as seated hip abduction for strengthening.  Pt will continue to benefit from skilled PT intervention for improvements in L hip pain and mobility.     Rehab Potential  Good    Clinical Impairments Affecting Rehab Potential  (+) age, motivated, no recent falls (-)  recurrent pain with pregnancy, severe pain at night    PT Frequency  2x / week    PT Duration  4 weeks    PT Treatment/Interventions  Cryotherapy;Electrical Stimulation;Moist Heat;Traction;Ultrasound;Gait training;Functional mobility Scientist, forensic;Therapeutic activities;Therapeutic exercise;Manual techniques;Passive range of motion;Dry needling;Taping    PT Next Visit Plan  pain relief, stretching    PT Home Exercise Plan  seated piriformis stretch, sidelying hip abduction    Consulted and Agree with Plan of Care  Patient       Patient will benefit from skilled therapeutic intervention in order to improve the following deficits and impairments:  Abnormal gait, Decreased mobility, Decreased strength, Pain  Visit Diagnosis: Pain in left hip     Problem List Patient Active Problem List   Diagnosis Date Noted  . Procreative investigation and testing 02/08/2018   Harriet Masson, SPT This entire session was performed under direct supervision and direction of a licensed therapist/therapist assistant . I have personally read, edited and approve of the note as written.  Trotter,Margaret PT, DPT 06/05/2018, 5:00 PM  [place] MAIN [facility] SERVICES [address] [place], [place], [address] Phone: [phone]   Fax:  [phone]  Name: Dinita Migliaccio MRN: [id number] Date of Birth: 05-27-1991

## 2018-06-12 ENCOUNTER — Encounter: Payer: Self-pay | Admitting: Physical Therapy

## 2018-06-12 ENCOUNTER — Ambulatory Visit: Payer: Medicaid Other | Admitting: Physical Therapy

## 2018-06-12 DIAGNOSIS — M25552 Pain in left hip: Secondary | ICD-10-CM

## 2018-06-12 NOTE — Therapy (Addendum)
[place] MAIN [facility] SERVICES [address] [place], [place], [address] Phone: [phone]   Fax:  [phone]  Physical Therapy Treatment/Progress Note   Dates of reporting period  05/21/2018   to   06/12/2018   Patient Details  Name: Lauren Stephens MRN: [id number] Date of Birth: 04/10/1991 Referring Provider (PT): Donnal Moat   Encounter Date: 06/12/2018  PT End of Session - 06/12/18 1117    Visit Number  4    Number of Visits  9    Date for PT Re-Evaluation  06/19/18    Authorization Type  progress note 4/10; eval 05/21/18    PT Start Time  1120    PT Stop Time  1205    PT Time Calculation (min)  45 min    Activity Tolerance  Patient tolerated treatment well;No increased pain    Behavior During Therapy  [facility] for tasks assessed/performed       History reviewed. No pertinent past medical history.  History reviewed. No pertinent surgical history.  There were no vitals filed for this visit.  Subjective Assessment - 06/12/18 1124    Subjective  Patient reports she is doing well today; does not have much pain currently when sitting. Pt states she does have some pain when walking. Pt reports pain relief from last session lasted until night time, which is when pain always increases.    Pertinent History  Pt is a 27 yo female with history of fall at end of last pregnancy about 3 years ago; pain resolved after giving birth 3 years ago. Pt's pain has returned with second pregnancy, due date 08/19/2018; most pain in posterior L hip and some pain in L heel when stepping. Pt has pain from L heel that radiates up to posterior hip; does have pain that radiates in L buttock.     Limitations  Standing;Walking;Lifting;Other (comment)   Kneeling down and standing back up   How long can you sit comfortably?  N/A    How long can you stand comfortably?  Increases pain after 20-30 min of prolonged standing    How long can you walk comfortably?   Increases pain after 20-30 min of walking/cleaning    Diagnostic tests  None    Patient Stated Goals  "Decrease pain and not have future health problems"    Currently in Pain?  No/denies    Pain Score  0-No pain          Treatment Seated figure 4 piriformis stretchx30 sec holds x2 reps on L only Seated figure 4 modified piriformis stretchx30 sec holds x2 reps on L only -Required VCs and demonstration for proper technique and positioning for best stretch  Forward trunk flexion with blue physioball x2 min, VCs to widen legs for increase posterior hip stretch Lateral trunk flexion to each side with blue physioball x2 min with VCs for increasing stretch in opposite side  Seated hip abduction with red tband above knees x10 reps, VCs for proper technique and avoiding painful ROM  R sidelying: -L hip abduction straight leg x10 reps -L clamshells x10 reps  VCs for proper technique and sequencing with tactile cues for proper muscle activation; no complaints of pain but able to feel muscles working  Manual therapy: -Rolling stick and EDGE tool over leggings to glut muscles and piriformis x15 min, increased pressure in piriformis area due to pt report of tenderness to this area; decreased tightness noted following rolling  Pt reported improved feeling  of mobility and more relaxed following rolling; reported 0/10 pain following session                      PT Education - 06/12/18 1116    Education Details  exercise technique/form, manual techniques, goal progression    Person(s) Educated  Patient    Methods  Explanation;Demonstration;Verbal cues    Comprehension  Verbalized understanding;Returned demonstration;Verbal cues required;Need further instruction       PT Short Term Goals - 06/12/18 1127      PT SHORT TERM GOAL #1   Title  Patient will be independent in home exercise program to improve strength/mobility for better functional independence with ADLs.     Baseline  05/21/18: seated piriformis stretch, sidelying hip abduction given; 06/12/2018: performs daily, mainly when has pain    Time  2    Period  Weeks    Status  On-going    Target Date  06/19/18        PT Long Term Goals - 06/12/18 1128      PT LONG TERM GOAL #1   Title  Pt will improve LEFS score to >60/80 to indicate improved function and ADL performance with decreased pain.    Baseline  05/21/18: 49/80; 06/12/18: 40/80    Time  4    Period  Weeks    Status  On-going    Target Date  06/19/18      PT LONG TERM GOAL #2   Title  Patient will report no greater than 6/10 pain in L hip at night to indicate improved mobility and sleep quality for better functional performance in ADLs.    Baseline  05/21/18: 8/10; 06/12/18: continues to be 8-9/10 at night    Time  4    Period  Weeks    Status  On-going    Target Date  06/19/18      PT LONG TERM GOAL #3   Title  Patient will improve L hip abduction score to at least 4/5 to improve functional strength for prolonged standing, walking, and grocery shopping.     Baseline  05/21/18: 3+/5; 06/12/18: 4-/5    Time  4    Period  Weeks    Status  Partially Met    Target Date  06/19/18            Plan - 06/12/18 1226    Clinical Impression Statement  Pt tolerated therapy session well. Pt performed seated stretching with VCs for proper technique and holding for extended periods of time. Pt performed seated hip abduction strengthening; required VCs for slowing motion for better strengthening. Pt was able to perform sidelying strengthening with hip ABD straight leg and sidelying clamshells; required VCs and tactile cuing for proper muscle activation and technique. PT performed rolling stick to L gluts and piriformis muscles; tried EDGE tool but pt preferred rolling stick due to increased pressure. Pt will continue to benefit from skilled PT intervention for improvements in L hip pain, mobility, and strength. Patient's condition has the  potential to improve in response to therapy. Maximum improvement is yet to be obtained. The anticipated improvement is attainable and reasonable in a generally predictable time.  Patient reports decreased pain throughout the day and overall intensity at night; improved mobility and standing tolerance.    Rehab Potential  Good    Clinical Impairments Affecting Rehab Potential  (+) age, motivated, no recent falls (-) recurrent pain with pregnancy, severe pain at night  PT Frequency  2x / week    PT Duration  4 weeks    PT Treatment/Interventions  Cryotherapy;Electrical Stimulation;Moist Heat;Traction;Ultrasound;Gait training;Functional mobility Scientist, forensic;Therapeutic activities;Therapeutic exercise;Manual techniques;Passive range of motion;Dry needling;Taping    PT Next Visit Plan  pain relief, stretching    PT Home Exercise Plan  seated piriformis stretch, sidelying hip abduction    Consulted and Agree with Plan of Care  Patient       Patient will benefit from skilled therapeutic intervention in order to improve the following deficits and impairments:  Abnormal gait, Decreased mobility, Decreased strength, Pain  Visit Diagnosis: Pain in left hip     Problem List Patient Active Problem List   Diagnosis Date Noted  . Procreative investigation and testing 02/08/2018   Harriet Masson, SPT This entire session was performed under direct supervision and direction of a licensed therapist/therapist assistant . I have personally read, edited and approve of the note as written.  Trotter,Margaret PT, DPT 06/12/2018, 2:02 PM  [place] MAIN [facility] SERVICES [address] [place], [place], [address] Phone: [phone]   Fax:  [phone]  Name: Lauren Stephens MRN: [id number] Date of Birth: 06/08/1991

## 2018-06-27 ENCOUNTER — Ambulatory Visit: Payer: Medicaid Other | Attending: Advanced Practice Midwife | Admitting: Physical Therapy

## 2018-06-27 DIAGNOSIS — M25552 Pain in left hip: Secondary | ICD-10-CM | POA: Insufficient documentation

## 2018-07-04 ENCOUNTER — Encounter: Payer: Self-pay | Admitting: Physical Therapy

## 2018-07-04 ENCOUNTER — Ambulatory Visit: Payer: Medicaid Other | Admitting: Physical Therapy

## 2018-07-04 ENCOUNTER — Encounter: Payer: Medicaid Other | Admitting: Physical Therapy

## 2018-07-04 DIAGNOSIS — M25552 Pain in left hip: Secondary | ICD-10-CM

## 2018-07-04 NOTE — Therapy (Addendum)
[place] MAIN [facility] SERVICES [address] [place], [place], [address] Phone: [phone]   Fax:  [phone]  Physical Therapy Treatment  Patient Details  Name: Lauren Stephens MRN: [id number] Date of Birth: 11-27-1990 Referring Provider (PT): Donnal Moat   Encounter Date: 07/04/2018  PT End of Session - 07/04/18 1301    Visit Number  5    Number of Visits  13    Date for PT Re-Evaluation  07/18/18    Authorization Type  progress note 5/10; eval 05/21/18    PT Start Time  1300    PT Stop Time  1345    PT Time Calculation (min)  45 min    Activity Tolerance  Patient tolerated treatment well;No increased pain    Behavior During Therapy  [facility] for tasks assessed/performed       History reviewed. No pertinent past medical history.  History reviewed. No pertinent surgical history.  There were no vitals filed for this visit.  Subjective Assessment - 07/04/18 1309    Subjective  Patient reports she is feeling better overall; no longer has pain at night except for weight of stomach with growing pregnancy.     Pertinent History  Pt is a 27 yo female with history of fall at end of last pregnancy about 3 years ago; pain resolved after giving birth 3 years ago. Pt's pain has returned with second pregnancy, due date 08/19/2018; most pain in posterior L hip and some pain in L heel when stepping. Pt has pain from L heel that radiates up to posterior hip; does have pain that radiates in L buttock.     Limitations  Standing;Walking;Lifting;Other (comment)   Kneeling down and standing back up   How long can you sit comfortably?  N/A    How long can you stand comfortably?  Increases pain after 20-30 min of prolonged standing    How long can you walk comfortably?  Increases pain after 20-30 min of walking/cleaning    Diagnostic tests  None    Patient Stated Goals  "Decrease pain and not have future health problems"    Currently in Pain?   No/denies          Treatment Discussed continued tx options with patients as well as update on progress and pain since last visit;   Seated figure 4 piriformis stretchx30 sec holds x2 reps on L only Seated figure 4 modified piriformis stretchx30 sec holds x2 reps on L only -Required VCs and demonstration for proper technique and positioning for best stretch  Forward trunk flexion with blue physioballx1 min, VCs to widen legs for increase posterior hip stretch Lateral trunk flexion to each side with blue physioballx1 min with VCs for increasing stretch in opposite side  Seated hip abduction with green tbandabove knees 2x10 reps, VCs for proper technique and holding in ABD for 3 count prior to relaxing  Sidelying: -Hip abduction straight leg x10 reps bilaterally -Clamshells x10 reps bilaterally VCs for proper technique and sequencing with tactile cues for proper muscle activation; no complaints of pain but able to feel muscles working                        PT Education - 07/04/18 1300    Education Details  exercise technique/form, manual techniques    Person(s) Educated  Patient    Methods  Explanation;Demonstration;Verbal cues    Comprehension  Verbalized understanding;Returned demonstration;Verbal cues required;Need further instruction  PT Short Term Goals - 07/04/18 1341      PT SHORT TERM GOAL #1   Title  Patient will be independent in home exercise program to improve strength/mobility for better functional independence with ADLs.    Baseline  05/21/18: seated piriformis stretch, sidelying hip abduction given; 06/12/2018: performs daily, mainly when has pain; 07/04/18: some of them at least every night but mostly when increased pain     Time  2    Period  Weeks    Status  On-going    Target Date  07/18/18        PT Long Term Goals - 07/04/18 1342      PT LONG TERM GOAL #1   Title  Pt will improve LEFS score to >60/80 to indicate  improved function and ADL performance with decreased pain.    Baseline  05/21/18: 49/80; 06/12/18: 40/80    Time  4    Period  Weeks    Status  On-going    Target Date  07/18/18      PT LONG TERM GOAL #2   Title  Patient will report no greater than 6/10 pain in L hip at night to indicate improved mobility and sleep quality for better functional performance in ADLs.    Baseline  05/21/18: 8/10; 06/12/18: continues to be 8-9/10 at night; 07/04/18: no greater than 5-6/10 at night    Time  4    Period  Weeks    Status  Achieved      PT LONG TERM GOAL #3   Title  Patient will improve L hip abduction score to at least 4/5 to improve functional strength for prolonged standing, walking, and grocery shopping.     Baseline  05/21/18: 3+/5; 06/12/18: 4-/5; 07/04/18: 4-/5    Time  4    Period  Weeks    Status  Partially Met    Target Date  07/18/18            Plan - 07/04/18 1518    Clinical Impression Statement  Patient tolerated therapy session well. She reports feeling as though her L hip pain and strength has significantly improved since beginning therapy in beginning of October. Pt tolerated increased challenge with seated hip abduction with green tband for resistance; requires VCs for holding ABD and slowing return to neutral. Pt performed continued LE strengthening with focus on glut med and hip ABD strength; VCs for proper sequencing and positioning for optimal strengthening. Pt presented with no pain at beginning of session and continued to have no pain at end of session.  Pt will continue to benefit from skilled PT intervention for improvements in L hip mobility and strength.    Rehab Potential  Good    Clinical Impairments Affecting Rehab Potential  (+) age, motivated, no recent falls (-) recurrent pain with pregnancy, severe pain at night    PT Frequency  2x / week    PT Duration  4 weeks    PT Treatment/Interventions  Cryotherapy;Electrical Stimulation;Moist  Heat;Traction;Ultrasound;Gait training;Functional mobility Scientist, forensic;Therapeutic activities;Therapeutic exercise;Manual techniques;Passive range of motion;Dry needling;Taping    PT Next Visit Plan  pain relief, stretching    PT Home Exercise Plan  seated piriformis stretch, sidelying hip abduction    Consulted and Agree with Plan of Care  Patient       Patient will benefit from skilled therapeutic intervention in order to improve the following deficits and impairments:  Abnormal gait, Decreased mobility, Decreased strength, Pain  Visit Diagnosis:  Pain in left hip     Problem List Patient Active Problem List   Diagnosis Date Noted  . Procreative investigation and testing 02/08/2018   Harriet Masson, SPT This entire session was performed under direct supervision and direction of a licensed therapist/therapist assistant . I have personally read, edited and approve of the note as written.  Trotter,Margaret PT, DPT 07/05/2018, 9:42 AM  [place] MAIN [facility] SERVICES [address] [place], [place], [address] Phone: [phone]   Fax:  [phone]  Name: Lauren Stephens MRN: [id number] Date of Birth: 1991-04-03

## 2018-07-09 ENCOUNTER — Ambulatory Visit: Payer: Medicaid Other | Admitting: Physical Therapy

## 2018-07-09 DIAGNOSIS — M25552 Pain in left hip: Secondary | ICD-10-CM

## 2018-07-09 NOTE — Patient Instructions (Addendum)
   Standing:  10 reps on both sides x 3 x day     3 point tap   Feet are hip width Tap forward, center under hip not feet next to each other  Tap middle\, center  Tap back     ______  Wall - [place], back of hips and back against wall Small pelvic tilts to press the back of the hips against wall  10reps 5 x day   _______   Standing posture  Shift navel forward slightly, shift weight across heel and ball mound, unlock knees  Neutral spine and pelvic alignment  SOFT knees NOT KNOCKED    ________  Sleeping with pillow between knees   _______  Move in bed, move incrementally, digging feet and elbows in firstbefore lifting/scooting the hips, Keep legs/knee  together     ____ breathing technique\ with labor ( exhale to push)     ____  Wear pregnancy belt during the day   ____ Return to pelvic PT for post partum rehab after your delivery once cleared by your OB/GYN.

## 2018-07-09 NOTE — Therapy (Signed)
[place] MAIN [facility] SERVICES [address] [place], [place], [address] Phone: [phone]   Fax:  [phone]  Physical Therapy Treatment  Patient Details  Name: Lauren Stephens MRN: [id number] Date of Birth: July 30, 1991 Referring Provider (PT): Donnal Moat   Encounter Date: 07/09/2018  PT End of Session - 07/09/18 1537    Visit Number  6    Number of Visits  13    Date for PT Re-Evaluation  07/18/18    Authorization Type  progress note 6/10; eval 05/21/18    PT Start Time  1503    PT Stop Time  1543    PT Time Calculation (min)  40 min    Activity Tolerance  Patient tolerated treatment well;No increased pain    Behavior During Therapy  [facility] for tasks assessed/performed       No past medical history on file.  No past surgical history on file.  There were no vitals filed for this visit.  Subjective Assessment - 07/09/18 1501    Subjective  Pt is feeling pain on the R back hip more than L when sleeping on both sides. Pt is due 08/19/18. Pain radiates down something to the heel. 5/10. When she walks alot, the pain will come on at night. The pain mostly is at night.      Pertinent History  Pt is a 27 yo female with history of fall at end of last pregnancy about 3 years ago; pain resolved after giving birth 3 years ago. Pt's pain has returned with second pregnancy, due date 08/19/2018; most pain in posterior L hip and some pain in L heel when stepping. Pt has pain from L heel that radiates up to posterior hip; does have pain that radiates in L buttock.     Limitations  Standing;Walking;Lifting;Other (comment)   Kneeling down and standing back up   How long can you sit comfortably?  N/A    How long can you stand comfortably?  Increases pain after 20-30 min of prolonged standing    How long can you walk comfortably?  Increases pain after 20-30 min of walking/cleaning    Diagnostic tests  None    Patient Stated Goals  "Decrease pain  and not have future health problems"         [facility] PT Assessment - 07/09/18 1522      Coordination   Gross Motor Movements are Fluid and Coordinated  --   breathing technique  with TrA for labor      AROM   Overall AROM   --   seated spinal ext (pre Tx: with pain), post Tx: no pain)      Strength   Overall Strength Comments  B hip abd 4-/5       Palpation   SI assessment   L PSIS more posterior, limited sacral nutation     Palpation comment  coccygeus/ sacrotuberous ligament L restricted                    [facility] Adult PT Treatment/Exercise - 07/09/18 1521      Neuro Re-ed    Neuro Re-ed Details   see pt instructions       Moist Heat Therapy   Number Minutes Moist Heat  --   5   Moist Heat Location  --   L hip     Manual Therapy   Manual therapy comments  long axis distraction on LLE, rotational mob  with MWM  hip flex/abd/ER                PT Short Term Goals - 07/04/18 1341      PT SHORT TERM GOAL #1   Title  Patient will be independent in home exercise program to improve strength/mobility for better functional independence with ADLs.    Baseline  05/21/18: seated piriformis stretch, sidelying hip abduction given; 06/12/2018: performs daily, mainly when has pain; 07/04/18: some of them at least every night but mostly when increased pain     Time  2    Period  Weeks    Status  On-going    Target Date  07/18/18        PT Long Term Goals - 07/04/18 1342      PT LONG TERM GOAL #1   Title  Pt will improve LEFS score to >60/80 to indicate improved function and ADL performance with decreased pain.    Baseline  05/21/18: 49/80; 06/12/18: 40/80    Time  4    Period  Weeks    Status  On-going    Target Date  07/18/18      PT LONG TERM GOAL #2   Title  Patient will report no greater than 6/10 pain in L hip at night to indicate improved mobility and sleep quality for better functional performance in ADLs.    Baseline  05/21/18: 8/10; 06/12/18:  continues to be 8-9/10 at night; 07/04/18: no greater than 5-6/10 at night    Time  4    Period  Weeks    Status  Achieved      PT LONG TERM GOAL #3   Title  Patient will improve L hip abduction score to at least 4/5 to improve functional strength for prolonged standing, walking, and grocery shopping.     Baseline  05/21/18: 3+/5; 06/12/18: 4-/5; 07/04/18: 4-/5    Time  4    Period  Weeks    Status  Partially Met    Target Date  07/18/18            Plan - 07/09/18 1537    Clinical Impression Statement  Pt showed improved L SIJ and sacral nutation mobility post external manual Tx. Pt showed more equal PSIS alignment post Tx.  Pt reported decreased L hip pain from 5/10 to 0/10.  Educated pt on proper sleeping mechanics with pillow between knees, standing posture, wearing SIJ belt, bed mobility mechanis to minimize pain. Pt continues to benefit from skilled PT.      Rehab Potential  Good    Clinical Impairments Affecting Rehab Potential  (+) age, motivated, no recent falls (-) recurrent pain with pregnancy, severe pain at night    PT Frequency  2x / week    PT Duration  4 weeks    PT Treatment/Interventions  Cryotherapy;Electrical Stimulation;Moist Heat;Traction;Ultrasound;Gait training;Functional mobility Scientist, forensic;Therapeutic activities;Therapeutic exercise;Manual techniques;Passive range of motion;Dry needling;Taping    PT Next Visit Plan  pain relief, stretching    PT Home Exercise Plan  seated piriformis stretch, sidelying hip abduction    Consulted and Agree with Plan of Care  Patient       Patient will benefit from skilled therapeutic intervention in order to improve the following deficits and impairments:  Abnormal gait, Decreased mobility, Decreased strength, Pain  Visit Diagnosis: Pain in left hip     Problem List Patient Active Problem List   Diagnosis Date Noted  . Procreative investigation and testing 02/08/2018  Jerl Mina ,PT, DPT,  E-RYT  07/09/2018, 3:47 PM  [place] MAIN [facility] SERVICES [address] [place], [place], [address] Phone: [phone]   Fax:  [phone]  Name: Lauren Stephens MRN: [id number] Date of Birth: Jan 12, 1991

## 2018-07-10 ENCOUNTER — Encounter: Payer: Self-pay | Admitting: Physical Therapy

## 2018-07-10 ENCOUNTER — Ambulatory Visit: Payer: Medicaid Other | Admitting: Physical Therapy

## 2018-07-10 DIAGNOSIS — M25552 Pain in left hip: Secondary | ICD-10-CM

## 2018-07-10 NOTE — Therapy (Signed)
[place] MAIN [facility] SERVICES [address] [place], [place], [address] Phone: [phone]   Fax:  [phone]  Physical Therapy Treatment  Patient Details  Name: Lauren Stephens MRN: [id number] Date of Birth: Jan 10, 1991 Referring Provider (PT): Donnal Moat   Encounter Date: 07/10/2018  PT End of Session - 07/10/18 1411    Visit Number  7    Number of Visits  13    Date for PT Re-Evaluation  07/18/18    Authorization Type  progress note 7/10; eval 05/21/18    PT Start Time  0205    PT Stop Time  0245    PT Time Calculation (min)  40 min    Activity Tolerance  Patient tolerated treatment well;No increased pain    Behavior During Therapy  [facility] for tasks assessed/performed       History reviewed. No pertinent past medical history.  History reviewed. No pertinent surgical history.  There were no vitals filed for this visit.  Subjective Assessment - 07/10/18 1410    Subjective  Patient says she has no pain now, just some when she walked into the hospital for appointment.    Pertinent History  Pt is a 27 yo female with history of fall at end of last pregnancy about 3 years ago; pain resolved after giving birth 3 years ago. Pt's pain has returned with second pregnancy, due date 08/19/2018; most pain in posterior L hip and some pain in L heel when stepping. Pt has pain from L heel that radiates up to posterior hip; does have pain that radiates in L buttock.     Limitations  Standing;Walking;Lifting;Other (comment)   Kneeling down and standing back up   How long can you sit comfortably?  N/A    How long can you stand comfortably?  Increases pain after 20-30 min of prolonged standing    How long can you walk comfortably?  Increases pain after 20-30 min of walking/cleaning    Diagnostic tests  None    Patient Stated Goals  "Decrease pain and not have future health problems"    Currently in Pain?  No/denies    Pain Score  0-No pain    Pain Onset  More than a month ago       Treatment: Nu-step x 5 mins Marching in supine x 20 Figure 4 supine x 30 sec x 3  left and right , guarded and slow Trunk rotation left and right x 10 , guarded and slow sidelying clam x 10  sidelying hip flex/ext x 10 Quadriped cat/camel x 110 Quadriped leg extension x 10 right , Left x 10  Seated hip abd/ER x 20       Patient reports that the pain increases to 5/10  Intermittently during stretching                      PT Education - 07/10/18 1411    Education Details  exercise technique/form, manual techniques    Person(s) Educated  Patient    Methods  Explanation    Comprehension  Returned demonstration;Need further instruction       PT Short Term Goals - 07/04/18 1341      PT SHORT TERM GOAL #1   Title  Patient will be independent in home exercise program to improve strength/mobility for better functional independence with ADLs.    Baseline  05/21/18: seated piriformis stretch, sidelying hip abduction given; 06/12/2018: performs daily, mainly when has  pain; 07/04/18: some of them at least every night but mostly when increased pain     Time  2    Period  Weeks    Status  On-going    Target Date  07/18/18        PT Long Term Goals - 07/04/18 1342      PT LONG TERM GOAL #1   Title  Pt will improve LEFS score to >60/80 to indicate improved function and ADL performance with decreased pain.    Baseline  05/21/18: 49/80; 06/12/18: 40/80    Time  4    Period  Weeks    Status  On-going    Target Date  07/18/18      PT LONG TERM GOAL #2   Title  Patient will report no greater than 6/10 pain in L hip at night to indicate improved mobility and sleep quality for better functional performance in ADLs.    Baseline  05/21/18: 8/10; 06/12/18: continues to be 8-9/10 at night; 07/04/18: no greater than 5-6/10 at night    Time  4    Period  Weeks    Status  Achieved      PT LONG TERM GOAL #3   Title  Patient will  improve L hip abduction score to at least 4/5 to improve functional strength for prolonged standing, walking, and grocery shopping.     Baseline  05/21/18: 3+/5; 06/12/18: 4-/5; 07/04/18: 4-/5    Time  4    Period  Weeks    Status  Partially Met    Target Date  07/18/18            Plan - 07/10/18 1416    Clinical Impression Statement  Patient tolerated therapy session well. She reports feeling as though her L hip pain and strength has significantly improved since beginning therapy in beginning of October. Pt tolerated increased challenge with quadriped cat / camel and quadriped leg extension. Marland Kitchen Pt performed continued LE strengthening with focus on glut med and hip ABD strength; VCs for proper sequencing and positioning for optimal strengthening. Pt presented with no pain at beginning of session and then had increased pain to 5/10 and then no pain at end of session. Pt will continue to benefit from skilled PT intervention for improvements in L hip mobility and strength    Rehab Potential  Good    Clinical Impairments Affecting Rehab Potential  (+) age, motivated, no recent falls (-) recurrent pain with pregnancy, severe pain at night    PT Frequency  2x / week    PT Duration  4 weeks    PT Treatment/Interventions  Cryotherapy;Electrical Stimulation;Moist Heat;Traction;Ultrasound;Gait training;Functional mobility Scientist, forensic;Therapeutic activities;Therapeutic exercise;Manual techniques;Passive range of motion;Dry needling;Taping    PT Next Visit Plan  pain relief, stretching    PT Home Exercise Plan  seated piriformis stretch, sidelying hip abduction    Consulted and Agree with Plan of Care  Patient       Patient will benefit from skilled therapeutic intervention in order to improve the following deficits and impairments:  Abnormal gait, Decreased mobility, Decreased strength, Pain  Visit Diagnosis: Pain in left hip     Problem List Patient Active Problem List    Diagnosis Date Noted  . Procreative investigation and testing 02/08/2018    Alanson Puls, PT DPT 07/10/2018, 2:19 PM  [place] MAIN [facility] SERVICES [address] [place], [place], [address] Phone: [phone]   Fax:  [phone]  Name: Timber Marshman MRN: [id number] Date of Birth: [birth year]

## 2018-07-16 ENCOUNTER — Ambulatory Visit: Payer: Medicaid Other | Admitting: Physical Therapy

## 2018-07-18 ENCOUNTER — Ambulatory Visit: Payer: Medicaid Other

## 2018-07-18 ENCOUNTER — Encounter: Payer: Self-pay | Admitting: Physical Therapy

## 2018-07-18 DIAGNOSIS — M25552 Pain in left hip: Secondary | ICD-10-CM

## 2018-07-18 NOTE — Therapy (Addendum)
[place] MAIN [facility] SERVICES [address] [place], [place], [address] Phone: [phone]   Fax:  [phone]  Physical Therapy Treatment and Discharge Note  Dates of reporting period  05/21/2018  to   07/18/2018   Patient Details  Name: Lauren Stephens MRN: [id number] Date of Birth: [birth year] Referring Provider (PT): Donnal Moat   Encounter Date: 07/18/2018  PT End of Session - 07/18/18 1412    Visit Number  8    Number of Visits  13    Date for PT Re-Evaluation  07/18/18    Authorization Type  progress note 8/10; eval 05/21/18    PT Start Time  1347    PT Stop Time  1432    PT Time Calculation (min)  45 min    Activity Tolerance  Patient tolerated treatment well    Behavior During Therapy  [facility] for tasks assessed/performed       History reviewed. No pertinent past medical history.  History reviewed. No pertinent surgical history.  There were no vitals filed for this visit.  Subjective Assessment - 07/18/18 1353    Subjective  Patient reports no pain at rest. Patient reports that she has some discomfort with bending foward both anterior/posterior. Feels therapy has really helped.  Patient reports feeling 50% better compared to start of physical therapy.     Pertinent History  Pt is a 27 yo female with history of fall at end of last pregnancy about 3 years ago; pain resolved after giving birth 3 years ago. Pt's pain has returned with second pregnancy, due date 08/19/2018; most pain in posterior L hip and some pain in L heel when stepping. Pt has pain from L heel that radiates up to posterior hip; does have pain that radiates in L buttock.     Limitations  Other (comment)   Pt reports pain with walking after a prolonged time, or the end of the day   How long can you sit comfortably?  N/A    How long can you stand comfortably?  Incresaed pain after several hours    How long can you walk comfortably?  increased pain after  several hours, mostly feels her pain at night    Diagnostic tests  None    Patient Stated Goals  "Decrease pain and not have future health problems"    Currently in Pain?  No/denies    Pain Score  --   4-5/10, best: 0/10   Pain Location  Hip    Pain Orientation  Left;Right;Posterior;Lower    Pain Descriptors / Indicators  Aching;Tightness    Pain Type  Chronic pain    Pain Onset  More than a month ago    Aggravating Factors   standing/walking/lifting       Objective:  07/18/18 0001  ROM / Strength  AROM / PROM / Strength AROM;Strength  AROM  Overall AROM  Within functional limits for tasks performed  AROM Assessment Site Lumbar;Hip;Knee  Lumbar Flexion 100%  Lumbar Extension 70%  Lumbar - Right Side Bend 905  Lumbar - Left Side Bend 90%  Lumbar - Right Rotation 90%  Lumbar - Left Rotation 90%  Strength  Overall Strength Comments grossly 4/5, (hip abduction assessed  in sitting)  Palpation  Palpation comment TTP of L glute max/med    Treatment: Therapeutic exercises:  PT and patient discussed condition, POC, PT role, expectations of PT, safety to decrease falls risk at home, importance of HEP compliance.  Access Code: [email or web address]   Seated Table Piriformis Stretch - 3 reps - 1 sets - 30 hold Seated Hip Abduction with Resistance with GTB; discussion/visual demonstration Seated Transversus Abdominis Bracing - 10 reps - 2 sets with verbal and visual cues Seated March with TrA activation x10 reps bilaterally with visual cues Seated Long Arc Quad with visual/verbal cues x10 bilaterally     PT Education - 07/18/18 1401    Education Details  HEP, condition, POC    Person(s) Educated  Patient    Methods  Explanation    Comprehension  Verbalized understanding;Returned demonstration;Need further instruction       PT Short Term Goals - 07/18/18 1438      PT SHORT TERM GOAL #1   Title  Patient will be independent in home exercise program to improve strength/mobility for  better functional independence with ADLs.    Baseline  05/21/18: seated piriformis stretch, sidelying hip abduction given; 06/12/2018: performs daily, mainly when has pain; 07/04/18: some of them at least every night but mostly when increased pain 07/18/2018: mentions pain at the end of her night, 4-5/10 at its worst, has updated HEP to ensure compliance.    Status  Not Met    Target Date  07/18/18        PT Long Term Goals - 07/18/18 1402      PT LONG TERM GOAL #1   Title  Pt will improve LEFS score to >60/80 to indicate improved function and ADL performance with decreased pain.    Baseline  05/21/18: 49/80; 06/12/18: 40/80 : 07/18/2018: 51/80    Status  Not Met    Target Date  07/18/18      PT LONG TERM GOAL #2   Title  Patient will report no greater than 6/10 pain in L hip at night to indicate improved mobility and sleep quality for better functional performance in ADLs.    Baseline  05/21/18: 8/10; 06/12/18: continues to be 8-9/10 at night; 07/04/18: no greater than 5-6/10 at night: 07/18/2018: pain is 4-5/10 at night    Target Date  07/18/18      PT LONG TERM GOAL #3   Title  Patient will improve L hip abduction score to at least 4/5 to improve functional strength for prolonged standing, walking, and grocery shopping.     Baseline  05/21/18: 3+/5; 06/12/18: 4-/5; 07/04/18: 4-/5 07/18/2018: 4/5 (sitting)    Status  Achieved    Target Date  07/18/18            Plan - 07/18/18 1441    Clinical Impression Statement  Patient demonstrated lumbar and LE ROM WFLs without complaints of pain. Minimal pain with L hip flexion MMT but grossly 4/5 or greater diffusely for LE. Patient has only mild tenderness to posterior hip musculature with palpation. Overall the patient reported and demonstrated improved ability to perform functional tasks and decreased pain, as well as awareness of how to self manage her condition. HEP updated this session and pt in agreement of discharge at this time. Pt  educated about importance of following up with PT post partum, especially home health PT.    PT Treatment/Interventions  Cryotherapy;Electrical Stimulation;Moist Heat;Traction;Ultrasound;Gait training;Functional mobility Scientist, forensic;Therapeutic activities;Therapeutic exercise;Manual techniques;Passive range of motion;Dry needling;Taping    PT Next Visit Plan  discharge    PT Home Exercise Plan  updated, see pt instruction section    Consulted and Agree with Plan of Care  Patient       Patient will benefit  from skilled therapeutic intervention in order to improve the following deficits and impairments:  Pain  Visit Diagnosis: Pain in left hip     Problem List Patient Active Problem List   Diagnosis Date Noted  . Procreative investigation and testing 02/08/2018    Lieutenant Diego PT, DPT 2:52 PM,07/18/18 [place] MAIN [facility] SERVICES [address] [place], [place], [address] Phone: [phone]   Fax:  [phone]  Name: Lauren Stephens MRN: [id number] Date of Birth: 1991-01-19

## 2018-08-07 ENCOUNTER — Inpatient Hospital Stay
Admission: EM | Admit: 2018-08-07 | Discharge: 2018-08-10 | DRG: 807 | Disposition: A | Discharge status: Home / Self Care | Payer: Medicaid Other | Attending: Obstetrics and Gynecology | Admitting: Obstetrics and Gynecology

## 2018-08-07 ENCOUNTER — Inpatient Hospital Stay: Payer: Medicaid Other | Admitting: Anesthesiology

## 2018-08-07 ENCOUNTER — Other Ambulatory Visit: Payer: Self-pay

## 2018-08-07 DIAGNOSIS — O365930 Maternal care for other known or suspected poor fetal growth, third trimester, not applicable or unspecified: Secondary | ICD-10-CM | POA: Diagnosis present

## 2018-08-07 DIAGNOSIS — Z3A38 38 weeks gestation of pregnancy: Secondary | ICD-10-CM | POA: Diagnosis not present

## 2018-08-07 DIAGNOSIS — O99824 Streptococcus B carrier state complicating childbirth: Secondary | ICD-10-CM | POA: Diagnosis present

## 2018-08-07 DIAGNOSIS — D259 Leiomyoma of uterus, unspecified: Secondary | ICD-10-CM | POA: Diagnosis present

## 2018-08-07 DIAGNOSIS — O99214 Obesity complicating childbirth: Secondary | ICD-10-CM | POA: Diagnosis present

## 2018-08-07 DIAGNOSIS — O3413 Maternal care for benign tumor of corpus uteri, third trimester: Secondary | ICD-10-CM | POA: Diagnosis present

## 2018-08-07 LAB — CBC
HCT: 39.6 % (ref 36.0–46.0)
Hemoglobin: 13.2 g/dL (ref 12.0–15.0)
MCH: 28.1 pg (ref 26.0–34.0)
MCHC: 33.3 g/dL (ref 30.0–36.0)
MCV: 84.3 fL (ref 80.0–100.0)
Platelets: 227 10*3/uL (ref 150–400)
RBC: 4.7 MIL/uL (ref 3.87–5.11)
RDW: 15.1 % (ref 11.5–15.5)
WBC: 10.6 10*3/uL — AB (ref 4.0–10.5)
nRBC: 0 % (ref 0.0–0.2)

## 2018-08-07 LAB — TYPE AND SCREEN
ABO/RH(D): O POS
Antibody Screen: NEGATIVE

## 2018-08-07 MED ORDER — FENTANYL 2.5 MCG/ML W/ROPIVACAINE 0.15% IN NS 100 ML EPIDURAL (ARMC)
12.0000 mL/h | EPIDURAL | Status: DC
  Administered 2018-08-08: 12 mL/h via EPIDURAL
  Filled 2018-08-07: qty 100

## 2018-08-07 MED ORDER — PENICILLIN G 3 MILLION UNITS IVPB - SIMPLE MED
3.0000 10*6.[IU] | INTRAVENOUS | Status: DC
  Administered 2018-08-08 (×2): 3 10*6.[IU] via INTRAVENOUS
  Filled 2018-08-07 (×8): qty 100

## 2018-08-07 MED ORDER — OXYTOCIN BOLUS FROM INFUSION
500.0000 mL | Freq: Once | INTRAVENOUS | Status: AC
  Administered 2018-08-08: 500 mL via INTRAVENOUS

## 2018-08-07 MED ORDER — OXYTOCIN 40 UNITS IN LACTATED RINGERS INFUSION - SIMPLE MED
2.5000 [IU]/h | INTRAVENOUS | Status: DC
  Filled 2018-08-07: qty 1000

## 2018-08-07 MED ORDER — ONDANSETRON HCL 4 MG/2ML IJ SOLN
4.0000 mg | Freq: Four times a day (QID) | INTRAMUSCULAR | Status: DC | PRN
  Administered 2018-08-08: 4 mg via INTRAVENOUS
  Filled 2018-08-07: qty 2

## 2018-08-07 MED ORDER — EPHEDRINE 5 MG/ML INJ
10.0000 mg | INTRAVENOUS | Status: DC | PRN
  Filled 2018-08-07: qty 2

## 2018-08-07 MED ORDER — PHENYLEPHRINE 40 MCG/ML (10ML) SYRINGE FOR IV PUSH (FOR BLOOD PRESSURE SUPPORT)
80.0000 ug | PREFILLED_SYRINGE | INTRAVENOUS | Status: DC | PRN
  Filled 2018-08-07: qty 10

## 2018-08-07 MED ORDER — TERBUTALINE SULFATE 1 MG/ML IJ SOLN: 0.2500 mg | Freq: Once | INTRAMUSCULAR | Status: DC | PRN

## 2018-08-07 MED ORDER — FENTANYL 2.5 MCG/ML W/ROPIVACAINE 0.15% IN NS 100 ML EPIDURAL (ARMC)
EPIDURAL | Status: AC
  Filled 2018-08-07: qty 100

## 2018-08-07 MED ORDER — LIDOCAINE HCL (PF) 1 % IJ SOLN
INTRAMUSCULAR | Status: DC | PRN
  Administered 2018-08-07: 1.2 mL

## 2018-08-07 MED ORDER — LACTATED RINGERS IV SOLN: 500.0000 mL | INTRAVENOUS | Status: DC | PRN

## 2018-08-07 MED ORDER — LIDOCAINE HCL (PF) 1 % IJ SOLN: 30.0000 mL | INTRAMUSCULAR | Status: DC | PRN

## 2018-08-07 MED ORDER — MISOPROSTOL 200 MCG PO TABS
ORAL_TABLET | ORAL | Status: AC
  Administered 2018-08-07: 25 ug via BUCCAL
  Filled 2018-08-07: qty 4

## 2018-08-07 MED ORDER — LIDOCAINE-EPINEPHRINE (PF) 1.5 %-1:200000 IJ SOLN
INTRAMUSCULAR | Status: DC | PRN
  Administered 2018-08-07: 3 mL via EPIDURAL

## 2018-08-07 MED ORDER — SOD CITRATE-CITRIC ACID 500-334 MG/5ML PO SOLN: 30.0000 mL | ORAL | Status: DC | PRN

## 2018-08-07 MED ORDER — ACETAMINOPHEN 325 MG PO TABS: 650.0000 mg | ORAL_TABLET | ORAL | Status: DC | PRN

## 2018-08-07 MED ORDER — OXYTOCIN 10 UNIT/ML IJ SOLN
INTRAMUSCULAR | Status: AC
  Filled 2018-08-07: qty 2

## 2018-08-07 MED ORDER — OXYTOCIN 40 UNITS IN LACTATED RINGERS INFUSION - SIMPLE MED
1.0000 m[IU]/min | INTRAVENOUS | Status: DC
  Administered 2018-08-08 (×2): 2 m[IU]/min via INTRAVENOUS
  Filled 2018-08-07: qty 1000

## 2018-08-07 MED ORDER — MISOPROSTOL 25 MCG QUARTER TABLET
25.0000 ug | ORAL_TABLET | ORAL | Status: DC | PRN
  Administered 2018-08-07: 25 ug via BUCCAL
  Filled 2018-08-07: qty 1

## 2018-08-07 MED ORDER — SODIUM CHLORIDE 0.9 % IV SOLN
5.0000 10*6.[IU] | Freq: Once | INTRAVENOUS | Status: AC
  Administered 2018-08-07: 5 10*6.[IU] via INTRAVENOUS
  Filled 2018-08-07: qty 5

## 2018-08-07 MED ORDER — FENTANYL CITRATE (PF) 100 MCG/2ML IJ SOLN: 100.0000 ug | INTRAMUSCULAR | Status: DC | PRN

## 2018-08-07 MED ORDER — DIPHENHYDRAMINE HCL 50 MG/ML IJ SOLN: 12.5000 mg | INTRAMUSCULAR | Status: DC | PRN

## 2018-08-07 MED ORDER — MISOPROSTOL 25 MCG QUARTER TABLET
25.0000 ug | ORAL_TABLET | ORAL | Status: DC | PRN
  Administered 2018-08-07: 25 ug via VAGINAL
  Filled 2018-08-07: qty 1

## 2018-08-07 MED ORDER — LACTATED RINGERS IV SOLN: 500.0000 mL | Freq: Once | INTRAVENOUS | Status: DC

## 2018-08-07 MED ORDER — AMMONIA AROMATIC IN INHA
RESPIRATORY_TRACT | Status: AC
  Filled 2018-08-07: qty 10

## 2018-08-07 MED ORDER — FENTANYL 2.5 MCG/ML W/ROPIVACAINE 0.15% IN NS 100 ML EPIDURAL (ARMC)
EPIDURAL | Status: DC | PRN
  Administered 2018-08-07: 12 mL/h via EPIDURAL

## 2018-08-07 MED ORDER — LIDOCAINE HCL (PF) 1 % IJ SOLN
INTRAMUSCULAR | Status: AC
  Filled 2018-08-07: qty 30

## 2018-08-07 MED ORDER — LACTATED RINGERS IV SOLN
INTRAVENOUS | Status: DC
  Administered 2018-08-07 – 2018-08-08 (×2): via INTRAVENOUS

## 2018-08-07 NOTE — H&P (Signed)
OB History & Physical   History of Present Illness:  Chief Complaint: here for IOL  HPI:  Lauren Stephens is a 27 y.o. G2P0 female at [redacted]w[redacted]d dated by [place] at [redacted]w[redacted]d.  She presents to L&D for induction of labor due to IUGR.  Active FM; reports irreg UCs at night, denies LOF or VB.     Pregnancy Issues: 1. IUGR, dx at 38wks, EFW 2456grams, <3% 2. Obesity, BMI 42 pre-pregnancy; chose not to take baby ASA, nl TSH and A1C 3. Infertility, anovulation and insulin resistance; conceived with Femara, Metformin 4. GBS Pos 5. Uterine fibroid, 1.9cm on anatomy US 6. Varicella Non-immune   Maternal Medical History:  History reviewed. No pertinent past medical history.  History reviewed. No pertinent surgical history.  No Known Allergies  Prior to Admission medications   Medication Sig Start Date End Date Taking? Authorizing Provider  ibuprofen (ADVIL,MOTRIN) 600 MG tablet Take 1 tablet (600 mg total) by mouth every 6 (six) hours as needed for moderate pain. Patient not taking: Reported on 02/08/2018 07/13/16   Duanne Guess, PA-C  oxyCODONE-acetaminophen (ROXICET) 5-325 MG tablet Take 1 tablet by mouth every 6 (six) hours as needed for severe pain. Patient not taking: Reported on 02/08/2018 07/13/16   Duanne Guess, PA-C  Prenatal Vit-Fe Fumarate-FA (PRENATAL MULTIVITAMIN) TABS tablet Take 1 tablet by mouth daily at 12 noon.    [provider]  sulfamethoxazole-trimethoprim (BACTRIM DS,SEPTRA DS) 800-160 MG tablet Take 1 tablet by mouth 2 (two) times daily. Patient not taking: Reported on 02/08/2018 07/13/16   Renata Caprice     Prenatal care site: [facility]   Social History: She  reports that she has never smoked. She has never used smokeless tobacco. She reports previous drug use. She reports that she does not drink alcohol.  Family History:  no Hx Gyn cancers  Review of Systems: A full review of systems was performed and negative except as  noted in the HPI.     Physical Exam:  Vital Signs: BP 122/67 (BP Location: Left Arm)   Pulse 80   Temp 98.6 F (37 C) (Oral)   Resp 18   Ht 5' (1.524 m)   Wt 101.6 kg   BMI 43.75 kg/m  General: no acute distress.  HEENT: normocephalic, atraumatic Heart: regular rate & rhythm.  No murmurs/rubs/gallops Lungs: clear to auscultation bilaterally, normal respiratory effort Abdomen: soft, gravid, non-tender;  EFW: 2456g Pelvic:   External: Normal external female genitalia  Cervix: Dilation: 2 / Effacement (%): 50 / Station: -3    Extremities: non-tender, symmetric, trace LE edema bilaterally.  DTRs: 2+  Neurologic: Alert & oriented x 3.    Results for orders placed or performed during the hospital encounter of 08/07/18 (from the past 24 hour(s))  CBC     Status: Abnormal   Collection Time: 08/07/18  6:40 PM  Result Value Ref Range   WBC 10.6 (H) 4.0 - 10.5 K/uL   RBC 4.70 3.87 - 5.11 MIL/uL   Hemoglobin 13.2 12.0 - 15.0 g/dL   HCT 39.6 36.0 - 46.0 %   MCV 84.3 80.0 - 100.0 fL   MCH 28.1 26.0 - 34.0 pg   MCHC 33.3 30.0 - 36.0 g/dL   RDW 15.1 11.5 - 15.5 %   Platelets 227 150 - 400 K/uL   nRBC 0.0 0.0 - 0.2 %  Type and screen [facility]     Status: None (Preliminary result)  Collection Time: 08/07/18  6:40 PM  Result Value Ref Range   ABO/RH(D) PENDING    Antibody Screen PENDING    Sample Expiration      08/10/2018 Performed at [facility], [address] [place], [place] [address]     Pertinent Results:  Prenatal Labs: Blood type/Rh  O Pos  Antibody screen neg  Rubella Immune  Varicella NON-Immune  RPR NR  HBsAg Neg  HIV NR  GC neg  Chlamydia neg  Genetic screening negative  1 hour GTT  139  3 hour GTT  [phone]  GBS  POS  Flu and Tdap given prenatally on 05/30/18  FHT: 130bpm, moderate variability, + accels 15*15; 1 variable decel noted with spontaneous recovery at 1849, none since.  TOCO: occasional UC noted.  SVE:   Dilation: 2 / Effacement (%): 50 / Station: -3    Cephalic by leopolds and SVE  No results found.  Assessment:  Lauren Stephens is a 80 y.o. G2P0 female at [redacted]w[redacted]d with IUGR.   Plan:  1. Admit to Labor & Delivery; consents reviewed and obtained  2. Fetal Well being  - Fetal Tracing: Cat I tracing, no recurrent variables.  - Group B Streptococcus ppx indicated: Pos, PCN started.  - Presentation: cephalic confirmed by exam   3. Routine OB: - Prenatal labs reviewed, as above - Rh O pos - CBC, T&S, RPR on admit - Clear fluids, IVF  4. Induction of Labor -  Contractions: external toco in place -  Pelvis proven to 6lbs -  Plan for induction with Cytotec/Pitocin/AROM -  Plan for continuous fetal monitoring  -  Maternal pain control as desired; requesting epidural when ready.  - Anticipate vaginal delivery  5. Post Partum Planning: - Infant feeding: both - Contraception: paragard IUD  Francetta Found, CNM 08/07/18 7:46 PM

## 2018-08-07 NOTE — Progress Notes (Signed)
Labor Progress Note  Lauren Stephens is a 27 y.o. G2P1001 at [redacted]w[redacted]d by [place] at [redacted]w[redacted]d.  She presents to L&D for induction of labor due to IUGR.  Subjective: painful UCs, now s/p epidural.   Objective: BP (!) 112/56   Pulse (!) 108   Temp 98.8 F (37.1 C) (Oral)   Resp 20   Ht 5' (1.524 m)   Wt 101.6 kg   SpO2 99%   BMI 43.75 kg/m  Notable VS details: reviewed  Fetal Assessment: FHT:  FHR: 140 bpm, variability: moderate,  accelerations:  Present,  decelerations:  Absent Category/reactivity:  Category I UC:   regular, every 2-3 minutes SVE:   3/50/-3, soft/posterior to pt right.  Membrane status: intact  Labs: Lab Results  Component Value Date   WBC 10.6 (H) 08/07/2018   HGB 13.2 08/07/2018   HCT 39.6 08/07/2018   MCV 84.3 08/07/2018   PLT 227 08/07/2018    Assessment / Plan: G2P1001 at [redacted]w[redacted]d, IOL for IUGR  Labor: s/p 1 dose of buccal and vaginal cytotec. contracting well. Will position with peanut ball to facilitate fetal descent and rotation.  Preeclampsia:  no e/o pre-e Fetal Wellbeing:  Category I Pain Control:  Epidural I/D:  GBS Pos, PCN x 1 dose given, due for next dose now.  Anticipated MOD:  NSVD  Francetta Found, CNM 08/07/2018, 11:53 PM

## 2018-08-07 NOTE — Anesthesia Preprocedure Evaluation (Signed)
Anesthesia Evaluation  Patient identified by MRN, date of birth, ID band Patient awake    Reviewed: Allergy & Precautions, NPO status , Patient's Chart, lab work & pertinent test results  History of Anesthesia Complications Negative for: history of anesthetic complications  Airway Mallampati: III       Dental   Pulmonary neg sleep apnea, neg COPD,           Cardiovascular (-) hypertension(-) Past MI and (-) CHF (-) dysrhythmias (-) Valvular Problems/Murmurs     Neuro/Psych neg Seizures    GI/Hepatic Neg liver ROS, neg GERD  ,  Endo/Other  neg diabetesMorbid obesity  Renal/GU negative Renal ROS     Musculoskeletal   Abdominal   Peds  Hematology   Anesthesia Other Findings   Reproductive/Obstetrics (+) Pregnancy                             Anesthesia Physical Anesthesia Plan  ASA: III  Anesthesia Plan: Epidural   Post-op Pain Management:    Induction:   PONV Risk Score and Plan:   Airway Management Planned:   Additional Equipment:   Intra-op Plan:   Post-operative Plan:   Informed Consent: I have reviewed the patients History and Physical, chart, labs and discussed the procedure including the risks, benefits and alternatives for the proposed anesthesia with the patient or authorized representative who has indicated his/her understanding and acceptance.     Plan Discussed with:   Anesthesia Plan Comments:         Anesthesia Quick Evaluation

## 2018-08-07 NOTE — Anesthesia Procedure Notes (Signed)
Epidural Patient location during procedure: OB Start time: 08/07/2018 11:05 PM End time: 08/07/2018 11:31 PM  Staffing Performed: anesthesiologist   Preanesthetic Checklist Completed: patient identified, site marked, surgical consent, pre-op evaluation, timeout performed, IV checked, risks and benefits discussed and monitors and equipment checked  Epidural Patient position: sitting Prep: Betadine Patient monitoring: heart rate, continuous pulse ox and blood pressure Approach: midline Location: L4-L5 Injection technique: LOR saline  Needle:  Needle type: Tuohy  Needle gauge: 17 G Needle length: 9 cm and 9 Catheter type: closed end flexible Catheter size: 20 Guage Catheter at skin depth: 11 cm Test dose: negative and 1.5% lidocaine with Epi 1:200 K  Assessment Events: blood not aspirated, injection not painful, no injection resistance, negative IV test and no paresthesia  Additional Notes   Patient tolerated the insertion well without complications.Reason for block:procedure for pain

## 2018-08-08 MED ORDER — SENNOSIDES-DOCUSATE SODIUM 8.6-50 MG PO TABS
2.0000 | ORAL_TABLET | ORAL | Status: DC
  Administered 2018-08-09 – 2018-08-10 (×2): 2 via ORAL
  Filled 2018-08-08 (×2): qty 2

## 2018-08-08 MED ORDER — ACETAMINOPHEN 325 MG PO TABS
650.0000 mg | ORAL_TABLET | ORAL | Status: DC | PRN
  Administered 2018-08-08 – 2018-08-10 (×3): 650 mg via ORAL
  Filled 2018-08-08 (×3): qty 2

## 2018-08-08 MED ORDER — SIMETHICONE 80 MG PO CHEW: 80.0000 mg | CHEWABLE_TABLET | ORAL | Status: DC | PRN

## 2018-08-08 MED ORDER — VARICELLA VIRUS VACCINE LIVE 1350 PFU/0.5ML IJ SUSR
0.5000 mL | INTRAMUSCULAR | Status: AC | PRN
  Administered 2018-08-10: 0.5 mL via SUBCUTANEOUS
  Filled 2018-08-08: qty 0.5

## 2018-08-08 MED ORDER — IBUPROFEN 600 MG PO TABS
600.0000 mg | ORAL_TABLET | Freq: Four times a day (QID) | ORAL | Status: DC
  Administered 2018-08-08 – 2018-08-10 (×8): 600 mg via ORAL
  Filled 2018-08-08 (×7): qty 1

## 2018-08-08 MED ORDER — DIPHENHYDRAMINE HCL 25 MG PO CAPS: 25.0000 mg | ORAL_CAPSULE | Freq: Four times a day (QID) | ORAL | Status: DC | PRN

## 2018-08-08 MED ORDER — DIBUCAINE 1 % RE OINT: 1.0000 "application " | TOPICAL_OINTMENT | RECTAL | Status: DC | PRN

## 2018-08-08 MED ORDER — BENZOCAINE-MENTHOL 20-0.5 % EX AERO: 1.0000 "application " | INHALATION_SPRAY | CUTANEOUS | Status: DC | PRN

## 2018-08-08 MED ORDER — ONDANSETRON HCL 4 MG/2ML IJ SOLN: 4.0000 mg | INTRAMUSCULAR | Status: DC | PRN

## 2018-08-08 MED ORDER — ZOLPIDEM TARTRATE 5 MG PO TABS: 5.0000 mg | ORAL_TABLET | Freq: Every evening | ORAL | Status: DC | PRN

## 2018-08-08 MED ORDER — ONDANSETRON HCL 4 MG PO TABS: 4.0000 mg | ORAL_TABLET | ORAL | Status: DC | PRN

## 2018-08-08 MED ORDER — PRENATAL MULTIVITAMIN CH
1.0000 | ORAL_TABLET | Freq: Every day | ORAL | Status: DC
  Administered 2018-08-08 – 2018-08-10 (×3): 1 via ORAL
  Filled 2018-08-08 (×3): qty 1

## 2018-08-08 MED ORDER — FERROUS SULFATE 325 (65 FE) MG PO TABS
325.0000 mg | ORAL_TABLET | Freq: Two times a day (BID) | ORAL | Status: DC
  Administered 2018-08-08 – 2018-08-10 (×4): 325 mg via ORAL
  Filled 2018-08-08 (×4): qty 1

## 2018-08-08 MED ORDER — WITCH HAZEL-GLYCERIN EX PADS: 1.0000 "application " | MEDICATED_PAD | CUTANEOUS | Status: DC | PRN

## 2018-08-08 MED ORDER — COCONUT OIL OIL
1.0000 "application " | TOPICAL_OIL | Status: DC | PRN
  Administered 2018-08-08: 1 via TOPICAL
  Filled 2018-08-08: qty 120

## 2018-08-08 NOTE — Lactation Note (Signed)
This note was copied from a baby's chart. Lactation Consultation Note  Patient Name: Lauren Stephens Today's Date: 08/08/2018     Maternal Data    Feeding    LATCH Score                   Interventions    Lactation Tools Discussed/Used     Consult Status  Lauren Stephens educated MOB on feeding cues, positioning for a good latch, how to listen for swallows and know when baby is full. LC also spoke with family about establishing milk supply, when and how to hand-express, the importance of skin to skin and clusterfeeding for "Lauren Stephens". MOB says that she doesn't have any questions about nursing at this time and feels comfortable latching baby on.     Lauren Stephens 08/08/2018, 2:41 PM

## 2018-08-08 NOTE — Discharge Summary (Signed)
Obstetrical Discharge Summary  Patient Name: Lauren Stephens DOB: 01-31-1991 MRN: [id number]  Date of Admission: 08/07/2018 Date of Delivery: 08/08/18 Delivered by: Hassan Buckler CNM Date of Discharge: 08/10/2018  Primary OB: [facility]  LMP:No LMP recorded. Patient is pregnant. EDC Estimated Date of Delivery: 08/19/18 Gestational Age at Delivery: [redacted]w[redacted]d   Antepartum complications:  1. IUGR, dx at 38wks, EFW 2456grams, <3% 2. Obesity, BMI 42 pre-pregnancy; chose not to take baby ASA, nl TSH and A1C 3. Infertility, anovulation and insulin resistance; conceived with Femara, Metformin 4. GBS Pos 5. Uterine fibroid, 1.9cm on anatomy US 6. Varicella Non-immune  Admitting Diagnosis: IUGR, 3rd trimester   Secondary Diagnosis: SVD, nuchal cord x 2  Patient Active Problem List   Diagnosis Date Noted  . Intrauterine growth restriction affecting care of mother, antepartum, third trimester, not applicable or unspecified fetus 08/07/2018  . Procreative investigation and testing 02/08/2018    Augmentation: Pitocin and Cytotec Complications: None Intrapartum complications/course: IOL with cytotec buccal/vaginal x 1, then pitocin. SROM with FHR decels and rapid delivery shortly after. Tight [place] x 2. Vigorous infant.  Date of Delivery: 08/08/18 Delivered By: Hassan Buckler CNM Delivery Type: spontaneous vaginal delivery Anesthesia: epidural Placenta: spontaneous Laceration: none Episiotomy: none Newborn Data: Live born female  Birth Weight: 5#11 APGAR: 8, 9  Newborn Delivery   Birth date/time:  08/08/2018 06:37:00 Delivery type:  Vaginal, Spontaneous       Postpartum Procedures: none  Post partum course:  Patient had an uncomplicated postpartum course.  By time of discharge on PPD#2, her pain was controlled on oral pain medications; she had appropriate lochia and was ambulating, voiding without difficulty and tolerating regular diet.  She was deemed stable for discharge to home.      Discharge Physical Exam: 08/10/2018 at 9:02 AM  BP 126/73 (BP Location: Left Arm)   Pulse (!) 57   Temp 98.4 F (36.9 C) (Oral)   Resp 20   Ht 5' (1.524 m)   Wt 101.6 kg   SpO2 98%   BMI 43.75 kg/m   General: NAD CV: RRR Pulm: CTABL, nl effort ABD: s/nd/nt, fundus firm and below the umbilicus Lochia: scant Perineum: well approximated/intact DVT Evaluation: LE non-ttp, no evidence of DVT on exam.  Hemoglobin  Date Value Ref Range Status  08/09/2018 11.1 (L) 12.0 - 15.0 g/dL Final   HGB  Date Value Ref Range Status  02/24/2014 12.0 12.0 - 16.0 g/dL Final   HCT  Date Value Ref Range Status  08/09/2018 33.8 (L) 36.0 - 46.0 % Final  02/24/2014 36.5 35.0 - 47.0 % Final     Disposition: stable, discharge to home. Baby Feeding: breastmilk and formula Baby Disposition: home with mom  Rh Immune globulin given: n/a Rubella vaccine given: n/a Varicella vaccine given: prior to DC Tdap vaccine given in AP setting: 05/30/18 Flu vaccine given in AP setting: 05/30/18  Contraception: desires Paragard IUD  Prenatal Labs:  Blood type/Rh  O Pos  Antibody screen neg  Rubella Immune  Varicella NON-Immune  RPR NR  HBsAg Neg  HIV NR  GC neg  Chlamydia neg  Genetic screening negative  1 hour GTT  139  3 hour GTT  [phone]  GBS  POS      Plan:  Lauren Stephens was discharged to home in good condition. Follow-up appointment with delivering provider in 6 weeks.  Discharge Medications: Allergies as of 08/10/2018   No Known Allergies     Medication List  STOP taking these medications   oxyCODONE-acetaminophen 5-325 MG tablet Commonly known as:  ROXICET   sulfamethoxazole-trimethoprim 800-160 MG tablet Commonly known as:  BACTRIM DS,SEPTRA DS     TAKE these medications   acetaminophen 325 MG tablet Commonly known as:  TYLENOL Take 2 tablets (650 mg total) by mouth every 4 (four) hours as needed (for pain scale < 4).   ferrous sulfate 325  (65 FE) MG tablet Take 1 tablet (325 mg total) by mouth 2 (two) times daily with a meal.   ibuprofen 600 MG tablet Commonly known as:  ADVIL,MOTRIN Take 1 tablet (600 mg total) by mouth every 6 (six) hours. What changed:    when to take this  reasons to take this   prenatal multivitamin Tabs tablet Take 1 tablet by mouth daily at 12 noon.   senna-docusate 8.6-50 MG tablet Commonly known as:  Senokot-S Take 2 tablets by mouth daily.         Signed:  Francetta Found, CNM 08/10/2018  9:02 AM   Hit refresh and delete this line

## 2018-08-08 NOTE — Progress Notes (Signed)
.   Labor Progress Note  Lauren Stephens is a 27 y.o. G2P1001 at [redacted]w[redacted]d by [place] at [redacted]w[redacted]d. She presents to L&D for induction of labor due to IUGR.   Subjective: comfortable with epidural  Objective: BP (!) 103/59   Pulse 70   Temp 98.8 F (37.1 C) (Oral)   Resp 20   Ht 5' (1.524 m)   Wt 101.6 kg   SpO2 100%   BMI 43.75 kg/m  Notable VS details: reviewed  Fetal Assessment: FHT:  FHR: 145 bpm, variability: moderate,  accelerations:  Present,  decelerations:  Absent Category/reactivity:  Category I UC:   regular, every 2-3 minutes, Pitocin at 84mu/min - difficult to trace UCs due to maternal habitus.  SVE:   4-5/50/-3, cx remains soft, posterior and to pt right. Fontanelles palpated by CNM. Cephalic presentation confirmed with BS [place].  Membrane status: intact   Labs: Lab Results  Component Value Date   WBC 10.6 (H) 08/07/2018   HGB 13.2 08/07/2018   HCT 39.6 08/07/2018   MCV 84.3 08/07/2018   PLT 227 08/07/2018    Assessment / Plan: G2P1001 at [redacted]w[redacted]d, IOL for IUGR  Labor: s/p 1 dose of buccal and vaginal cytotec and on Pitocin now, difficult to titrate due to poor toco tracing.  Preeclampsia:  no e/o pre-e Fetal Wellbeing:  Category I Pain Control:  Epidural I/D:  GBS Pos, PCN x 2 doses (1927, 4709), next due at 0500 Anticipated MOD:  [place], CNM 08/08/2018, 4:49 AM

## 2018-08-09 LAB — CBC
HCT: 33.8 % — ABNORMAL LOW (ref 36.0–46.0)
Hemoglobin: 11.1 g/dL — ABNORMAL LOW (ref 12.0–15.0)
MCH: 28 pg (ref 26.0–34.0)
MCHC: 32.8 g/dL (ref 30.0–36.0)
MCV: 85.1 fL (ref 80.0–100.0)
Platelets: 189 10*3/uL (ref 150–400)
RBC: 3.97 MIL/uL (ref 3.87–5.11)
RDW: 15.5 % (ref 11.5–15.5)
WBC: 10.4 10*3/uL (ref 4.0–10.5)
nRBC: 0 % (ref 0.0–0.2)

## 2018-08-09 LAB — RPR: RPR Ser Ql: NONREACTIVE

## 2018-08-09 NOTE — Progress Notes (Signed)
Post Partum Day 1 Subjective: no complaints, up ad lib and voiding  Objective: Blood pressure 113/64, pulse 62, temperature 98.2 F (36.8 C), temperature source Oral, resp. rate 18, height 5' (1.524 m), weight 101.6 kg, SpO2 100 %.  Physical Exam:  General: alert and cooperative Lochia: appropriate Uterine Fundus: firm DVT Evaluation: No evidence of DVT seen on physical exam. Negative Homan's sign.  Recent Labs    08/07/18 1840 08/09/18 0537  HGB 13.2 11.1*  HCT 39.6 33.8*    Assessment/Plan: Plan for discharge tomorrow and Breastfeeding   LOS: 2 days   Benjaman Kindler 08/09/2018, 8:57 AM

## 2018-08-09 NOTE — Lactation Note (Signed)
This note was copied from a baby's chart. Lactation Consultation Note  Patient Name: Lauren Stephens Today's Date: 08/09/2018     Maternal Data    Feeding Feeding Type: Bottle Fed - Formula Nipple Type: Slow - flow  LATCH Score                   Interventions    Lactation Tools Discussed/Used     Consult Status  Mother states she would like to breastfeed as well as bottle feed and states that she would like to initiate pumping. Mother states that she is traveling to [place] in the next month for 2-3 days and will probably stop breastfeeding when she leaves but would like to give infant as much breast milk as she can via breast and bottle. LC provided mother with a DEBP kit to initiate pumping and instructed mother how to use pump according to [employer] instructions.     Elvera Lennox 06/21/5944, 4:08 PM

## 2018-08-09 NOTE — Anesthesia Postprocedure Evaluation (Signed)
Anesthesia Post Note  Patient: Lauren Stephens  Procedure(s) Performed: AN AD [facility]  Patient location during evaluation: Mother Baby Anesthesia Type: Epidural Level of consciousness: awake and alert Pain management: pain level controlled Vital Signs Assessment: post-procedure vital signs reviewed and stable Respiratory status: spontaneous breathing, nonlabored ventilation and respiratory function stable Cardiovascular status: stable Postop Assessment: no headache, no backache and epidural receding Anesthetic complications: no     Last Vitals:  Vitals:   08/09/18 0817 08/09/18 0819  BP:  113/64  Pulse: 62 62  Resp: 18   Temp: 36.8 C   SpO2: 100%     Last Pain:  Vitals:   08/09/18 0817  TempSrc: Oral  PainSc:                  Estill Batten

## 2018-08-09 NOTE — Plan of Care (Signed)
Patient's vital signs stable; fundus firm; small amount rubra lochia; voiding; good appetite; good po fluids; breastfeeding infant with good technique observed; good maternal-infant bonding observed; pain controlled with po motrin.

## 2018-08-10 LAB — SURGICAL PATHOLOGY

## 2018-08-10 MED ORDER — SENNOSIDES-DOCUSATE SODIUM 8.6-50 MG PO TABS: 2.0000 | ORAL_TABLET | ORAL | Status: DC

## 2018-08-10 MED ORDER — IBUPROFEN 600 MG PO TABS: 600.0000 mg | ORAL_TABLET | Freq: Four times a day (QID) | ORAL | 0 refills | Status: DC

## 2018-08-10 MED ORDER — ACETAMINOPHEN 325 MG PO TABS: 650.0000 mg | ORAL_TABLET | ORAL | Status: DC | PRN

## 2018-08-10 MED ORDER — FERROUS SULFATE 325 (65 FE) MG PO TABS: 325.0000 mg | ORAL_TABLET | Freq: Two times a day (BID) | ORAL | 3 refills | Status: DC

## 2018-11-11 ENCOUNTER — Encounter (HOSPITAL_COMMUNITY): Payer: Self-pay

## 2019-01-23 ENCOUNTER — Other Ambulatory Visit: Payer: Medicaid Other

## 2019-01-23 ENCOUNTER — Telehealth: Payer: Self-pay | Admitting: Internal Medicine

## 2019-01-23 DIAGNOSIS — Z20822 Contact with and (suspected) exposure to covid-19: Secondary | ICD-10-CM

## 2019-01-23 NOTE — Telephone Encounter (Signed)
Thayer Headings for [facility] Department called to this patient scheduled for the covid-19 test. Pt is experiencing some  Symptoms.  Pt notified regarding testing. She is scheduled for a 1 pm time at the [facility] in [place] today. She is instructed to wear a mask, stay in car with windows rolled up and until ready to be tested. Pt voiced understanding.

## 2019-01-25 LAB — NOVEL CORONAVIRUS, NAA: SARS-CoV-2, NAA: DETECTED — AB

## 2019-06-10 DIAGNOSIS — E8881 Metabolic syndrome: Secondary | ICD-10-CM

## 2019-06-11 ENCOUNTER — Ambulatory Visit: Payer: Self-pay

## 2019-06-11 ENCOUNTER — Ambulatory Visit (LOCAL_COMMUNITY_HEALTH_CENTER): Payer: Self-pay

## 2019-06-11 ENCOUNTER — Other Ambulatory Visit: Payer: Self-pay

## 2019-06-11 DIAGNOSIS — Z23 Encounter for immunization: Secondary | ICD-10-CM

## 2019-08-01 ENCOUNTER — Ambulatory Visit (LOCAL_COMMUNITY_HEALTH_CENTER): Payer: Self-pay | Admitting: Advanced Practice Midwife

## 2019-08-01 ENCOUNTER — Other Ambulatory Visit: Payer: Self-pay

## 2019-08-01 VITALS — BP 134/79 | Ht 60.0 in | Wt 217.0 lb

## 2019-08-01 DIAGNOSIS — Z3046 Encounter for surveillance of implantable subdermal contraceptive: Secondary | ICD-10-CM

## 2019-08-01 DIAGNOSIS — Z3009 Encounter for other general counseling and advice on contraception: Secondary | ICD-10-CM

## 2019-08-01 NOTE — Progress Notes (Signed)
   [place] problem visit  [place] Department  Subjective:  Lauren Stephens is a 28 y.o.G2P2 MHF being seen today for Nexplanon removal; her husband wants a third child  Chief Complaint  Patient presents with  . Contraception    wants nexplanon removed    HPI  G2P2 nonsmoker with Nexplanon inserted 12/11/18 and now wants removal.  Her youngest child is almost 37 year old.  Last sex 07/27/19.  LMP 11/2018.  +Covid 01/25/19. Does the patient have a current or past history of drug use? No   No components found for: HCV]   Health Maintenance Due  Topic Date Due  . TETANUS/TDAP  02/28/2012    ROS  The following portions of the patient's history were reviewed and updated as appropriate: allergies, current medications, past family history, past medical history, past social history, past surgical history and problem list. Problem list updated.   See flowsheet for other program required questions.  Objective:   Vitals:   08/01/19 1007  BP: 134/79  Weight: 217 lb (98.4 kg)  Height: 5' (1.524 m)    Physical Exam    Assessment and Plan:  Lauren Stephens is a 28 y.o. female presenting to the [facility] Department for a Women's Health problem visit  1. Morbid obesity ([place]) BMI=42.3   2. Family planning Husband wants one more pregnancy  3. Encounter for surveillance of implantable subdermal contraceptive   4. Nexplanon removal  Nexplanon Removal Patient identified, informed consent performed, consent signed.   Appropriate time out taken. Nexplanon site identified.  Area prepped in usual sterile fashon. 3 ml of 1% lidocaine with Epinephrine was used to anesthetize the area at the distal end of the implant and along implant site. A small stab incision was made right beside the implant on the distal portion.  The Nexplanon rod was grasped using straight hemostats/manual and removed without difficulty.  There was  minimal blood loss. There were no complications.  Steri-strips were applied over the small incision.  A pressure bandage was applied to reduce any bruising.  The patient tolerated the procedure well and was given post procedure instructions.   Nexplanon:   Counseled patient to take OTC analgesic starting as soon as lidocaine starts to wear off and take regularly for at least 48 hr to decrease discomfort.  Specifically to take with food or milk to decrease stomach upset and for IB 600 mg (3 tablets) every 6 hrs; IB 800 mg (4 tablets) every 8 hrs; or Aleve 2 tablets every 12 hrs.      Return if symptoms worsen or fail to improve.  No future appointments.  Herbie Saxon, CNM

## 2019-08-01 NOTE — Progress Notes (Signed)
Here for nexplanon removal, planning pregnancy.Jenetta Downer, RN

## 2019-08-14 IMAGING — US US MFM FETAL NUCHAL TRANSLUCENCY
1 series · 13 of 28 positions shown · non-contrast
Comparison: none

PATIENT INFO:

MORALES
PERFORMED BY:
SERVICE(S) PROVIDED:
INDICATIONS:
12 weeks gestation of pregnancy
FETAL EVALUATION:
Num Of Fetuses:     1
Fetal Heart         158
Rate(bpm):
Cardiac Activity:   Present
Presentation:       Variable
Placenta:           Anterior
GESTATIONAL AGE:
LMP:           17w 0d        Date:  10/12/17                 EDD:   07/19/18
Best:          12w 4d     Det. By:  Early Ultrasound          EDD:  08/19/18
(12/25/17)
1ST TRIMESTER GENETIC SONOGRAM SCREENING:
CRL:            67.1  mm    G. Age:   13w 0d                 EDD:   08/16/18
Nuc Trans:       1.3  mm
CERVIX UTERUS ADNEXA:
Left Ovary
Size(cm)       3.1  x   1.4    x  2.4       Vol(ml):
Right Ovary
Size(cm)       2.3  x   1.5    x  2         Vol(ml):

[Series 1: us mfm fetal nuchal translucency · 13 of 38 slices shown]
[im 2/38]
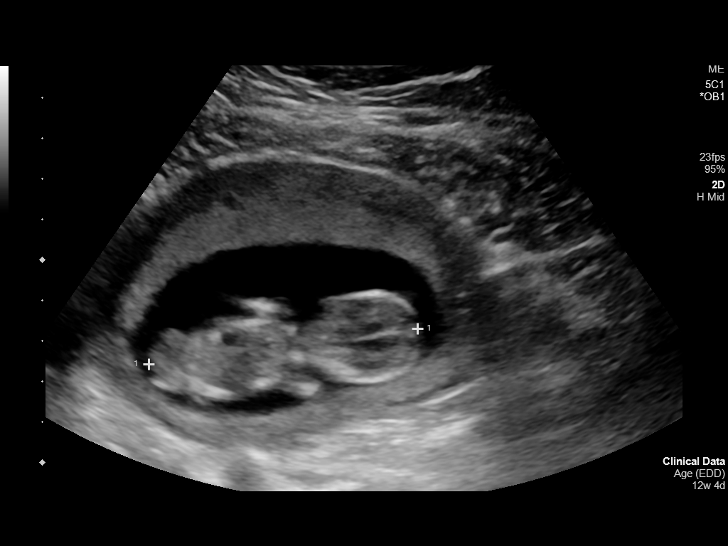
[im 5/38]
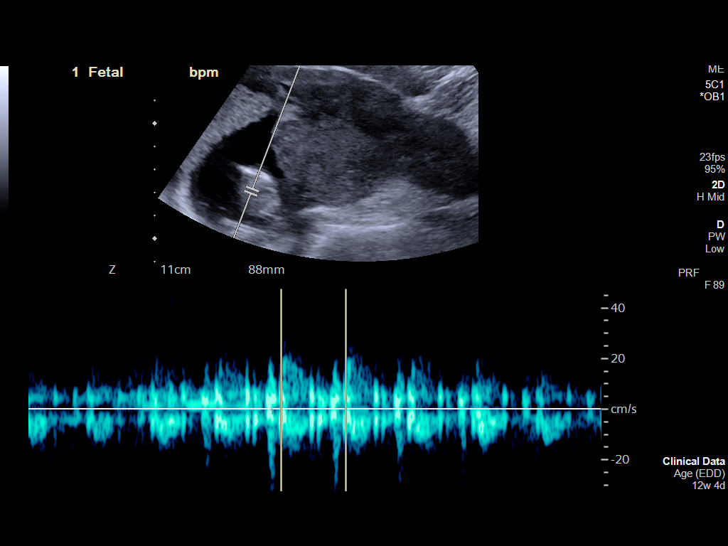
[im 7/38]
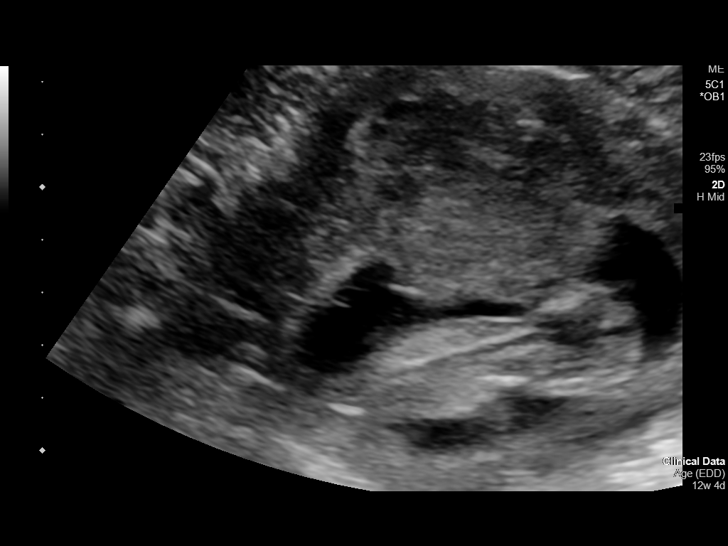
[im 10/38]
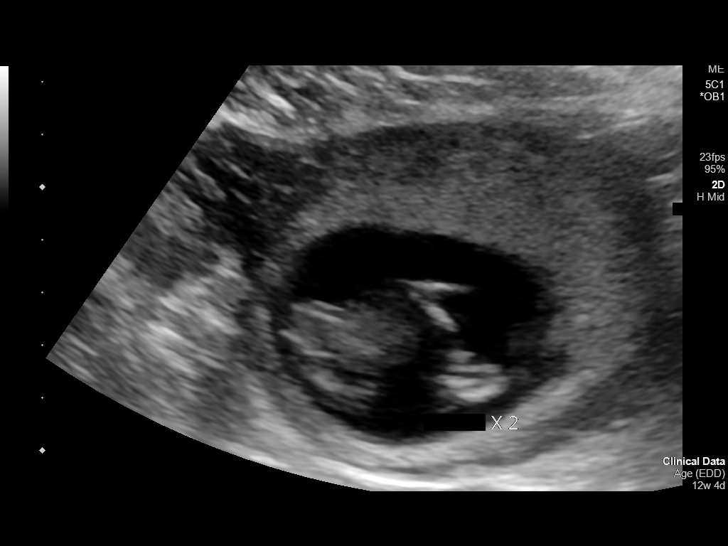
[im 13/38]
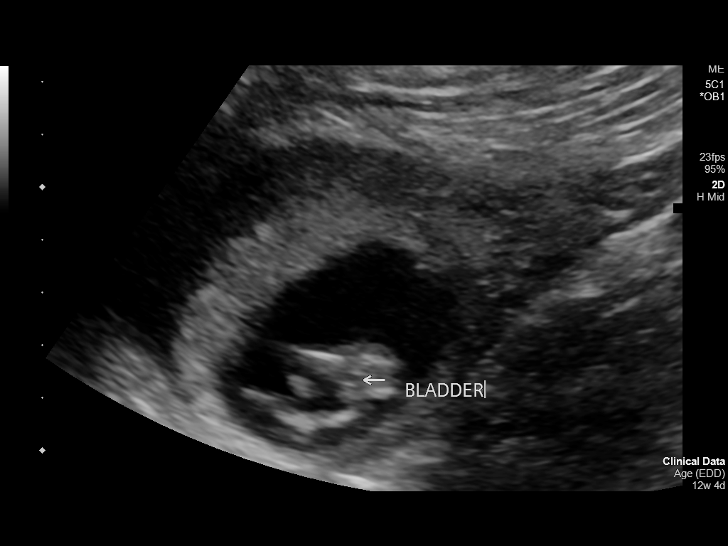
[im 16/38]
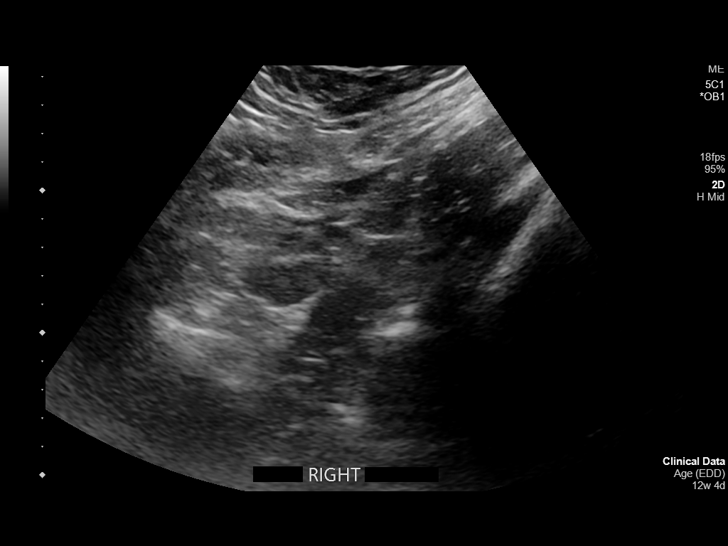
[im 20/38]
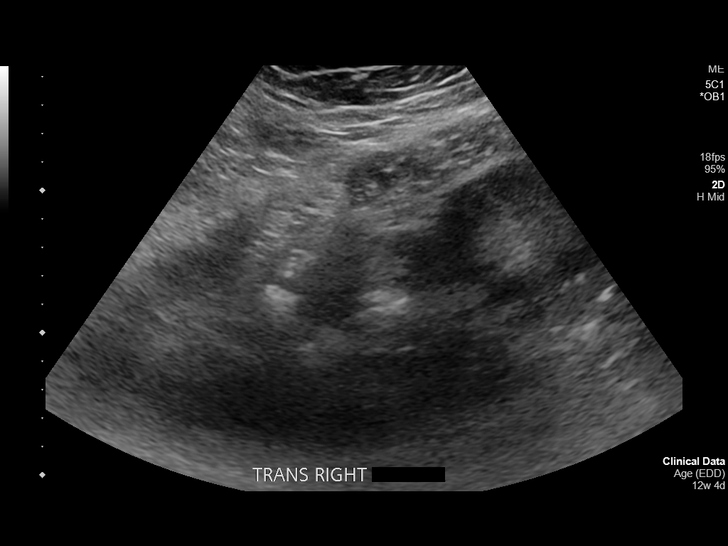
[im 22/38]
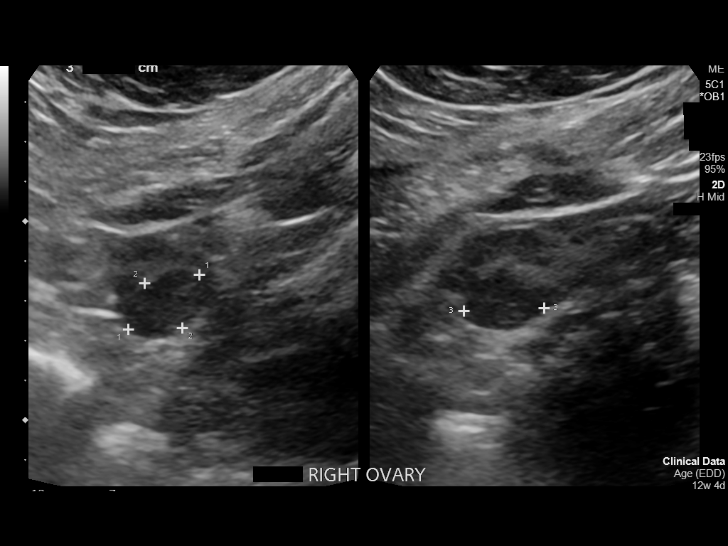
[im 25/38]
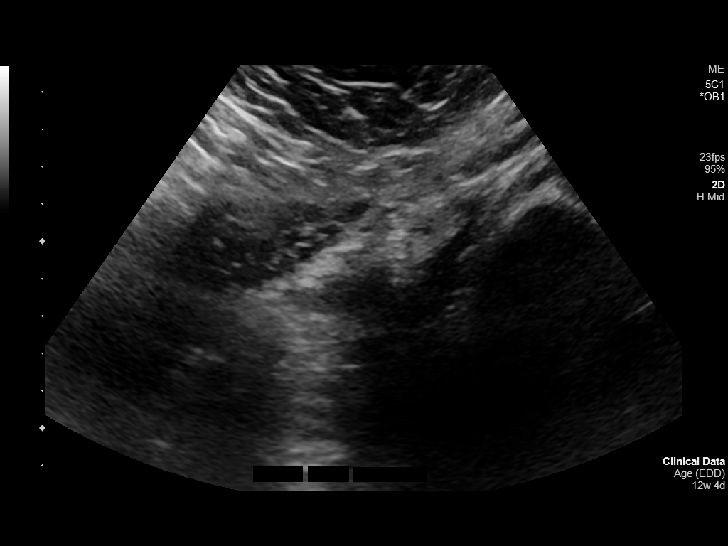
[im 28/38]
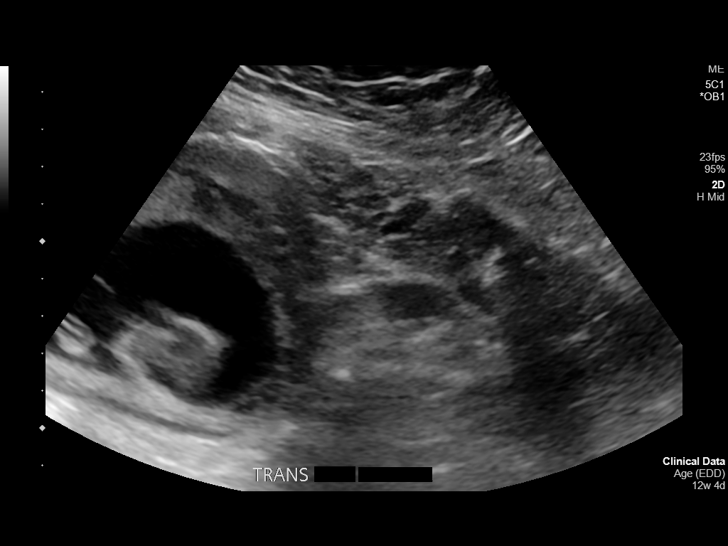
[im 31/38]
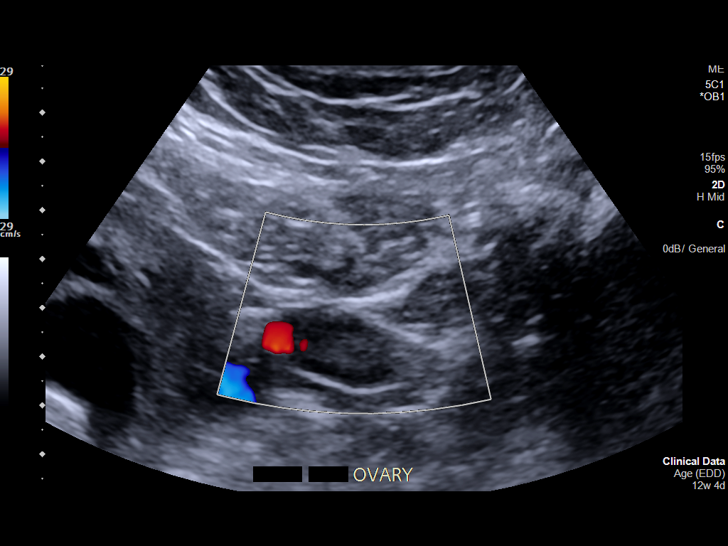
[im 33/38]
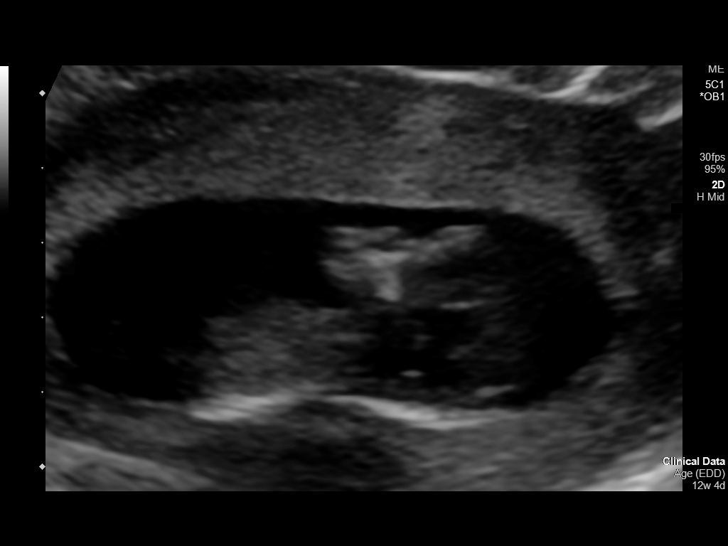
[im 36/38]
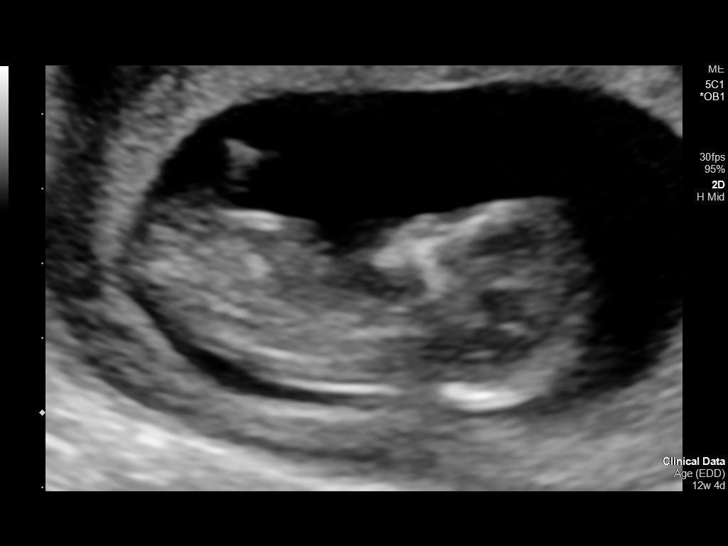

[13 of 28 positions shown; findings below may reference images not displayed]

IMPRESSION: Dear Dr. PON,

Thank you for referring your patient to [facility] for the
first trimester nuchal translucency screening program.

A singleton gestation is noted.

The fetal biometry correlates with established dating.

The nuchal translucency measured  1.3 mm.

The ovaries were WNL.

The patient was scheduled to have blood drawn for first
trimester screening.

A composite risk incorporating her age, nuchal translucency
measurement and blood results should be returned to your
office shortly.

The only way to exclude a diagnosis of aneuploidy is by
invasive testing such as chorionic villus sampling or
amniocentesis. Genetic counseling is available through our
office if further discussion is deemed necessary.

Thank you for allowing us to participate in your patient's care.

assistance.

## 2020-07-29 ENCOUNTER — Ambulatory Visit (LOCAL_COMMUNITY_HEALTH_CENTER): Payer: Self-pay | Admitting: Physician Assistant

## 2020-07-29 ENCOUNTER — Other Ambulatory Visit: Payer: Self-pay

## 2020-07-29 ENCOUNTER — Ambulatory Visit: Payer: Self-pay

## 2020-07-29 ENCOUNTER — Encounter: Payer: Self-pay | Admitting: Physician Assistant

## 2020-07-29 VITALS — BP 130/74 | Ht 61.0 in | Wt 217.0 lb

## 2020-07-29 DIAGNOSIS — Z01419 Encounter for gynecological examination (general) (routine) without abnormal findings: Secondary | ICD-10-CM

## 2020-07-29 DIAGNOSIS — Z113 Encounter for screening for infections with a predominantly sexual mode of transmission: Secondary | ICD-10-CM

## 2020-07-29 DIAGNOSIS — Z3009 Encounter for other general counseling and advice on contraception: Secondary | ICD-10-CM

## 2020-07-29 LAB — PREGNANCY, URINE: Preg Test, Ur: NEGATIVE

## 2020-07-29 LAB — WET PREP FOR TRICH, YEAST, CLUE
Trichomonas Exam: NEGATIVE
Yeast Exam: NEGATIVE

## 2020-07-29 NOTE — Progress Notes (Signed)
UPT is negative today. Wet mount reviewed with provider and no treatment needed for wet mount per Antoine Primas, PA verbal order. Counseled pt per provider orders and pt states understanding. Pt scheduled to return to care for repeat UPT and  Nexplanon insertion 08/10/2020, appt card given to pt and pt aware to arrive early for check-in. Pt also aware that from now until Nexplanon insertion to either not have sex or use condoms every time and pt states understanding. Provider orders completed.

## 2020-07-29 NOTE — Progress Notes (Signed)
Pt is here for physical and Nexplanon insertion. Pt reports last period was about 2 months ago but that periods are irregular. Pt reports last sex was 07/24/2020 without condom. Pt reports having an area that is burning on the outside of her vaginal opening that she is concerned about.

## 2020-07-30 LAB — IGP, RFX APTIMA HPV ASCU: PAP Smear Comment: 0

## 2020-07-31 ENCOUNTER — Encounter: Payer: Self-pay | Admitting: Physician Assistant

## 2020-07-31 NOTE — Progress Notes (Signed)
Family Planning Visit- Repeat Yearly Visit  Subjective:  Lauren Stephens is a 29 y.o. G2P2002  being seen today for an well woman visit and to discuss family planning options.    She is currently using None for pregnancy prevention. Patient reports she does not want a pregnancy in the next year. Patient  has Procreative investigation and testing; Intrauterine growth restriction affecting care of mother, antepartum, third trimester, not applicable or unspecified fetus; Insulin resistance; and Morbid obesity ([place]) BMI=42.3 on their problem list.  Chief Complaint  Patient presents with  . Contraception    Physical and Nexplanon insertion    Patient reports that she would like a Nexplanon insertion for BCM.  States that she had thought about getting pregnant but she and her partner have changed their minds and want to wait a year or more before getting pregnant.  Reports that she is working on losing weight by working out and watching what she eats.  Reports that she has had an itching spot in the vaginal area for a few days and wants to make sure she does not have an infection.  Per chart review, CBE is due 2022 and pap is due now.   Patient denies any other concerns.   See flowsheet for other program required questions.   Body mass index is 41 kg/m. - Patient is eligible for diabetes screening based on BMI and age >19?  not applicable [id number] ordered? not applicable  Patient reports 1partner in last year. Desires STI screening?  Yes   Has patient been screened once for HCV in the past?  No  No results found for: HCVAB  Does the patient have current of drug use, have a partner with drug use, and/or has been incarcerated since last result? No  If yes-- Screen for HCV through [facility]   Does the patient meet criteria for HBV testing? No  Criteria:  -Household, sexual or needle sharing contact with HBV -History of drug use -HIV positive -Those with known Hep C   Health  Maintenance Due  Topic Date Due  . Hepatitis C Screening  Never done  . COVID-19 Vaccine (1) Never done  . TETANUS/TDAP  Never done  . PAP-Cervical Cytology Screening  11/18/2019    Review of Systems  All other systems reviewed and are negative.   The following portions of the patient's history were reviewed and updated as appropriate: allergies, current medications, past family history, past medical history, past social history, past surgical history and problem list. Problem list updated.  Objective:   Vitals:   07/29/20 1440  BP: 130/74  Weight: 217 lb (98.4 kg)  Height: 5\' 1"  (1.549 m)    Physical Exam Vitals and nursing note reviewed.  Constitutional:      General: She is not in acute distress.    Appearance: Normal appearance.  HENT:     Head: Normocephalic and atraumatic.     Mouth/Throat:     Mouth: Mucous membranes are moist.     Pharynx: Oropharynx is clear. No oropharyngeal exudate or posterior oropharyngeal erythema.  Eyes:     Conjunctiva/sclera: Conjunctivae normal.  Neck:     Thyroid: No thyroid mass, thyromegaly or thyroid tenderness.  Cardiovascular:     Rate and Rhythm: Normal rate and regular rhythm.  Pulmonary:     Effort: Pulmonary effort is normal.     Breath sounds: Normal breath sounds.  Abdominal:     Palpations: Abdomen is soft. There is no  mass.     Tenderness: There is no abdominal tenderness. There is no guarding or rebound.  Genitourinary:    General: Normal vulva.     Rectum: Normal.     Comments: External genitalia/pubic area without nits, lice, edema, erythema, lesions and inguinal adenopathy. Upper outer left labia majora with ~1cm pinkish, healed area and small scar and signs of excoriation. Vagina with normal mucosa and discharge. Cervix without visible lesions. Uterus firm, mobile, nt, no masses, no CMT, no adnexal tenderness or fullness. Musculoskeletal:     Cervical back: Neck supple. No tenderness.  Lymphadenopathy:      Cervical: No cervical adenopathy.  Skin:    General: Skin is warm and dry.     Findings: No bruising, erythema, lesion or rash.  Neurological:     Mental Status: She is alert and oriented to person, place, and time.  Psychiatric:        Mood and Affect: Mood normal.        Behavior: Behavior normal.        Thought Content: Thought content normal.        Judgment: Judgment normal.       Assessment and Plan:  Lauren Stephens is a 29 y.o. female G2P2002 presenting to the [facility] Department for an yearly well woman exam/family planning visit  Contraception counseling: Reviewed all forms of birth control options in the tiered based approach. available including abstinence; over the counter/barrier methods; hormonal contraceptive medication including pill, patch, ring, injection,contraceptive implant, ECP; hormonal and nonhormonal IUDs; permanent sterilization options including vasectomy and the various tubal sterilization modalities. Risks, benefits, and typical effectiveness rates were reviewed.  Questions were answered.  Written information was also given to the patient to review.  Patient desires Nexplanon insertion. She will follow up in  2 weeks for insertion and prn for surveillance.  She was told to call with any further questions, or with any concerns about this method of contraception.  Emphasized use of condoms 100% of the time for STI prevention.  Patient was offered ECP. ECP was not accepted by the patient. ECP counseling was given.  1. Encounter for counseling regarding contraception Patient counseled that due to risk of pregnancy, we will need to have her RTC for Nexplanon insertion after being sure that she is not pregnant. Counseled that a urine pregnancy test detects pregnancy hormone when someone is 12-14 days pregnant. Enc patient to abstain until after Nexplanon insertion or use condoms with all sex in interim. - Pregnancy, urine  2. Screening for  STD (sexually transmitted disease) Await test results.  Counseled that RN will call once results are back if needs to RTC for treatment.  - WET PREP FOR [place], YEAST, [facility]  3. Well woman exam with routine gynecological exam Reviewed with patient healthy habits to maintain general health. Enc patient to continue with efforts to lose weight. Enc MVI 1 po daily. Enc to establish with/ follow up with PCP for primary care concerns, age appropriate screenings and illness. Await results of pap.  Counseled that RN will call or send letter once results are back.  - IGP, rfx Aptima HPV ASCU     Return in about 12 days (around 08/10/2020) for repeat UPT and Nexplanon insertion, annual and PRN.  Future Appointments  Date Time Provider [place]  08/10/2020 10:40 AM [facility] PROVIDER [facility] None    Jerene Dilling, PA

## 2020-08-10 ENCOUNTER — Ambulatory Visit (LOCAL_COMMUNITY_HEALTH_CENTER): Payer: Self-pay | Admitting: Physician Assistant

## 2020-08-10 ENCOUNTER — Other Ambulatory Visit: Payer: Self-pay

## 2020-08-10 ENCOUNTER — Encounter: Payer: Self-pay | Admitting: Physician Assistant

## 2020-08-10 VITALS — BP 119/78 | Ht 61.0 in | Wt 219.4 lb

## 2020-08-10 DIAGNOSIS — Z3009 Encounter for other general counseling and advice on contraception: Secondary | ICD-10-CM

## 2020-08-10 DIAGNOSIS — Z30017 Encounter for initial prescription of implantable subdermal contraceptive: Secondary | ICD-10-CM

## 2020-08-10 LAB — PREGNANCY, URINE: Preg Test, Ur: NEGATIVE

## 2020-08-10 MED ORDER — ETONOGESTREL 68 MG ~~LOC~~ IMPL
68.0000 mg | DRUG_IMPLANT | Freq: Once | SUBCUTANEOUS | Status: AC
  Administered 2020-08-10: 68 mg via SUBCUTANEOUS

## 2020-08-10 NOTE — Progress Notes (Signed)
Pt is here for Nexplanon insertion. Pt reports has not had sex since before she was here on 07/29/2020. RN counseling for Nexplanon insertion completed, Nexplanon consult completed. Consent forms reviewed and signed by pt.

## 2020-08-10 NOTE — Progress Notes (Signed)
Urine pregnancy test negative.Jenetta Downer, RN

## 2020-08-11 NOTE — Progress Notes (Signed)
Lauren Stephens problem visit  [place] Department  Subjective:  Lauren Stephens is a 29 y.o. being seen today for pregnancy test and Nexplanon insertion.  Chief Complaint  Patient presents with  . Contraception    Nexplanon insertion    HPI  Patient states that she would like a Nexplanon insertion.  States that she has not had sex since before her last visit.   Does the patient have a current or past history of drug use? No   No components found for: HCV]   Health Maintenance Due  Topic Date Due  . Hepatitis C Screening  Never done  . COVID-19 Vaccine (1) Never done  . TETANUS/TDAP  Never done  . PAP-Cervical Cytology Screening  11/18/2019    Review of Systems  All other systems reviewed and are negative.   The following portions of the patient's history were reviewed and updated as appropriate: allergies, current medications, past family history, past medical history, past social history, past surgical history and problem list. Problem list updated.   See flowsheet for other program required questions.  Objective:   Vitals:   08/10/20 1049  BP: 119/78  Weight: 219 lb 6.4 oz (99.5 kg)  Height: 5\' 1"  (1.549 m)    Physical Exam Vitals and nursing note reviewed.  Constitutional:      General: She is not in acute distress.    Appearance: Normal appearance.  HENT:     Head: Normocephalic and atraumatic.  Eyes:     Conjunctiva/sclera: Conjunctivae normal.  Pulmonary:     Effort: Pulmonary effort is normal.  Skin:    General: Skin is warm and dry.  Neurological:     Mental Status: She is alert and oriented to person, place, and time.  Psychiatric:        Mood and Affect: Mood normal.        Behavior: Behavior normal.        Thought Content: Thought content normal.        Judgment: Judgment normal.       Assessment and Plan:  Lauren Stephens is a 29 y.o. female presenting to the [facility]  Department for a Women's Health problem visit  1. Encounter for counseling regarding contraception Reviewed with patient risks, benefits and SE of Nexplanon and when to call clinic for irregular bleeding. Enc condoms with all sex for 10 days after insertion and always for STD protection. Pregnancy test was negative today, so will proceed with Nexplanon insertion.  - Pregnancy, urine  2. Nexplanon insertion Nexplanon Insertion Procedure Patient identified, informed consent performed, consent signed.   Patient does understand that irregular bleeding is a very common side effect of this medication. She was advised to have backup contraception after placement. Patient was determined to meet WHO criteria for not being pregnant. Appropriate time out taken.  The insertion site was identified 8-10 cm (3-4 inches) from the medial epicondyle of the humerus and 3-5 cm (1.25-2 inches) posterior to (below) the sulcus (groove) between the biceps and triceps muscles of the patient's left arm and marked.  Patient was prepped with alcohol swab and then injected with 3 ml of 1% lidocaine.  Arm was prepped with chlorhexidene, Nexplanon removed from packaging,  Device confirmed in needle, then inserted full length of needle and withdrawn per handbook instructions. Nexplanon was able to palpated in the patient's arm; patient palpated the insert herself. There was minimal blood loss.  Patient insertion  site covered with guaze and a pressure bandage to reduce any bruising.  The patient tolerated the procedure well and was given post procedure instructions.  Nexplanon:   Counseled patient to take OTC analgesic starting as soon as lidocaine starts to wear off and take regularly for at least 48 hr to decrease discomfort.  Specifically to take with food or milk to decrease stomach upset and for IB 600 mg (3 tablets) every 6 hrs; IB 800 mg (4 tablets) every 8 hrs; or Aleve 2 tablets every 12 hrs.   - etonogestrel (NEXPLANON)  implant 68 mg     No follow-ups on file.  No future appointments.  Lauren Dilling, PA

## 2020-12-17 ENCOUNTER — Ambulatory Visit: Payer: Self-pay

## 2020-12-23 ENCOUNTER — Ambulatory Visit: Payer: Self-pay

## 2020-12-28 ENCOUNTER — Ambulatory Visit (LOCAL_COMMUNITY_HEALTH_CENTER): Payer: Self-pay | Admitting: Advanced Practice Midwife

## 2020-12-28 ENCOUNTER — Encounter: Payer: Self-pay | Admitting: Advanced Practice Midwife

## 2020-12-28 VITALS — BP 113/68 | Ht 61.0 in | Wt 221.0 lb

## 2020-12-28 DIAGNOSIS — Z3009 Encounter for other general counseling and advice on contraception: Secondary | ICD-10-CM

## 2020-12-28 DIAGNOSIS — Z309 Encounter for contraceptive management, unspecified: Secondary | ICD-10-CM

## 2020-12-28 DIAGNOSIS — Z3049 Encounter for surveillance of other contraceptives: Secondary | ICD-10-CM

## 2020-12-28 NOTE — Progress Notes (Signed)
Patient here for Nexplanon removal. Last PE and Pap was 07/29/2020. Pap was NIL. Nexplanon inserted on 08/10/2020. Patient wants nexplanon removal due to weight gain.Jenetta Downer, RN

## 2020-12-28 NOTE — Progress Notes (Signed)
Contraception/Family Planning VISIT ENCOUNTER NOTE  Subjective:   Lauren Stephens is a 30 y.o.MHF [id number] nonsmoker female here for reproductive life counseling.  Desires Nexplanon removal for  BCM.  Reports she does not know want a pregnancy in the next year. Denies abnormal vaginal bleeding, discharge, pelvic pain, problems with intercourse or other gynecologic concerns. LMP 09/2020 and last sex yesterday without condom.  Nexplanon inserted 08/10/20 and wants removal due to 21 lb wt gain since insertion despite Zoomba 2x/wk and treadmill qo day x 30 min. Last PE 07/29/20. Last pap 07/29/20 neg   Gynecologic History No LMP recorded (lmp unknown). (Menstrual status: Irregular Periods). Contraception: none  Health Maintenance Due  Topic Date Due  . COVID-19 Vaccine (1) Never done  . Hepatitis C Screening  Never done  . TETANUS/TDAP  Never done  . PAP-Cervical Cytology Screening  11/18/2019     The following portions of the patient's history were reviewed and updated as appropriate: allergies, current medications, past family history, past medical history, past social history, past surgical history and problem list.  Review of Systems Pertinent items are noted in HPI.   Objective:  BP 113/68   Ht 5\' 1"  (1.549 m)   Wt 221 lb (100.2 kg)   LMP  (LMP Unknown)   BMI 41.76 kg/m  Gen: well appearing, NAD HEENT: no scleral icterus CV: RR Lung: Normal WOB Ext: warm well perfused     Assessment and Plan:   Contraception counseling: Reviewed all forms of birth control options in the tiered based approach. available including abstinence; over the counter/barrier methods; hormonal contraceptive medication including pill, patch, ring, injection,contraceptive implant, ECP; hormonal and nonhormonal IUDs; permanent sterilization options including vasectomy and the various tubal sterilization modalities. Risks, benefits, and typical effectiveness rates were reviewed.  Questions were  answered.  Written information was also given to the patient to review.  Patient desires Nexplanon removal, this was prescribed for patient. She will follow up in  prn for surveillance.  She was told to call with any further questions, or with any concerns about this method of contraception.  Emphasized use of condoms 100% of the time for STI prevention.  Patient was not a candidate for ECP. ECP was not accepted by the patient. ECP counseling was not given - see RN documentation  1. Family planning Pt desires Nexplanon removal and no birth control. Ok with a pregnancy but states wants to lose weight Counseled may become pregnant from yesterday's unprotected coitus  2. Encounter for contraceptive management, unspecified type Nexplanon Removal Patient identified, informed consent performed, consent signed.   Appropriate time out taken. Nexplanon site identified.  Area prepped in usual sterile fashon. 3 ml of 1% lidocaine with Epinephrine was used to anesthetize the area at the distal end of the implant and along implant site. A small stab incision was made right beside the implant on the distal portion.  The Nexplanon rod was grasped using straight hemostats/manual and removed without difficulty.  There was minimal blood loss. There were no complications.  Steri-strips were applied over the small incision.  A pressure bandage was applied to reduce any bruising.  The patient tolerated the procedure well and was given post procedure instructions.   Nexplanon:   Counseled patient to take OTC analgesic starting as soon as lidocaine starts to wear off and take regularly for at least 48 hr to decrease discomfort.  Specifically to take with food or milk to decrease stomach upset and for IB 600 mg (  3 tablets) every 6 hrs; IB 800 mg (4 tablets) every 8 hrs; or Aleve 2 tablets every 12 hrs.      Please refer to After Visit Summary for other counseling recommendations.   Return if symptoms worsen or fail to  improve.  Herbie Saxon, [place]

## 2020-12-28 NOTE — Progress Notes (Signed)
Patient declined BCM and states if she gets pregnant that's ok. Jenetta Downer, RN

## 2022-06-24 ENCOUNTER — Other Ambulatory Visit: Payer: Self-pay

## 2022-06-24 ENCOUNTER — Encounter: Payer: Self-pay | Admitting: Emergency Medicine

## 2022-06-24 ENCOUNTER — Emergency Department
Admission: EM | Admit: 2022-06-24 | Discharge: 2022-06-24 | Disposition: A | Discharge status: Home / Self Care | Payer: Self-pay | Attending: Emergency Medicine | Admitting: Emergency Medicine

## 2022-06-24 ENCOUNTER — Emergency Department: Payer: Self-pay

## 2022-06-24 DIAGNOSIS — R1013 Epigastric pain: Secondary | ICD-10-CM | POA: Insufficient documentation

## 2022-06-24 DIAGNOSIS — R112 Nausea with vomiting, unspecified: Secondary | ICD-10-CM | POA: Insufficient documentation

## 2022-06-24 DIAGNOSIS — R109 Unspecified abdominal pain: Secondary | ICD-10-CM

## 2022-06-24 DIAGNOSIS — R197 Diarrhea, unspecified: Secondary | ICD-10-CM | POA: Insufficient documentation

## 2022-06-24 LAB — LIPASE, BLOOD: Lipase: 26 U/L (ref 11–51)

## 2022-06-24 LAB — CBC WITH DIFFERENTIAL/PLATELET
Abs Immature Granulocytes: 0.06 10*3/uL (ref 0.00–0.07)
Basophils Absolute: 0.1 10*3/uL (ref 0.0–0.1)
Basophils Relative: 0 %
Eosinophils Absolute: 0.1 10*3/uL (ref 0.0–0.5)
Eosinophils Relative: 1 %
HCT: 43.7 % (ref 36.0–46.0)
Hemoglobin: 14.3 g/dL (ref 12.0–15.0)
Immature Granulocytes: 0 %
Lymphocytes Relative: 18 %
Lymphs Abs: 3.3 10*3/uL (ref 0.7–4.0)
MCH: 26.6 pg (ref 26.0–34.0)
MCHC: 32.7 g/dL (ref 30.0–36.0)
MCV: 81.4 fL (ref 80.0–100.0)
Monocytes Absolute: 1.2 10*3/uL — ABNORMAL HIGH (ref 0.1–1.0)
Monocytes Relative: 7 %
Neutro Abs: 13.1 10*3/uL — ABNORMAL HIGH (ref 1.7–7.7)
Neutrophils Relative %: 74 %
Platelets: 327 10*3/uL (ref 150–400)
RBC: 5.37 MIL/uL — ABNORMAL HIGH (ref 3.87–5.11)
RDW: 14 % (ref 11.5–15.5)
WBC: 17.7 10*3/uL — ABNORMAL HIGH (ref 4.0–10.5)
nRBC: 0 % (ref 0.0–0.2)

## 2022-06-24 LAB — COMPREHENSIVE METABOLIC PANEL
ALT: 26 U/L (ref 0–44)
AST: 19 U/L (ref 15–41)
Albumin: 4.5 g/dL (ref 3.5–5.0)
Alkaline Phosphatase: 67 U/L (ref 38–126)
Anion gap: 7 (ref 5–15)
BUN: 12 mg/dL (ref 6–20)
CO2: 31 mmol/L (ref 22–32)
Calcium: 9.6 mg/dL (ref 8.9–10.3)
Chloride: 100 mmol/L (ref 98–111)
Creatinine, Ser: 0.81 mg/dL (ref 0.44–1.00)
GFR, Estimated: >60 mL/min (ref >60)
Glucose, Bld: 114 mg/dL — ABNORMAL HIGH (ref 70–99)
Potassium: 4 mmol/L (ref 3.5–5.1)
Sodium: 138 mmol/L (ref 135–145)
Total Bilirubin: 0.8 mg/dL (ref 0.3–1.2)
Total Protein: 8.4 g/dL — ABNORMAL HIGH (ref 6.5–8.1)

## 2022-06-24 LAB — URINALYSIS, ROUTINE W REFLEX MICROSCOPIC
Bacteria, UA: NONE SEEN
Bilirubin Urine: NEGATIVE
Glucose, UA: NEGATIVE mg/dL
Hgb urine dipstick: NEGATIVE
Ketones, ur: 20 mg/dL — AB
Nitrite: NEGATIVE
Protein, ur: 100 mg/dL — AB
Specific Gravity, Urine: 1.038 — ABNORMAL HIGH (ref 1.005–1.030)
pH: 5 (ref 5.0–8.0)

## 2022-06-24 LAB — HCG, QUANTITATIVE, PREGNANCY: hCG, Beta Chain, Quant, S: <1 m[IU]/mL (ref <5)

## 2022-06-24 MED ORDER — DICYCLOMINE HCL 10 MG PO CAPS: 10.0000 mg | ORAL_CAPSULE | Freq: Three times a day (TID) | ORAL | 0 refills | Status: DC | PRN

## 2022-06-24 MED ORDER — ONDANSETRON 4 MG PO TBDP
4.0000 mg | ORAL_TABLET | Freq: Once | ORAL | Status: AC
  Administered 2022-06-24: 4 mg via ORAL
  Filled 2022-06-24: qty 1

## 2022-06-24 MED ORDER — LIDOCAINE VISCOUS HCL 2 % MT SOLN
15.0000 mL | Freq: Once | OROMUCOSAL | Status: AC
  Administered 2022-06-24: 15 mL via ORAL
  Filled 2022-06-24: qty 15

## 2022-06-24 MED ORDER — ALUM & MAG HYDROXIDE-SIMETH 200-200-20 MG/5ML PO SUSP
30.0000 mL | Freq: Once | ORAL | Status: AC
  Administered 2022-06-24: 30 mL via ORAL
  Filled 2022-06-24: qty 30

## 2022-06-24 MED ORDER — IOHEXOL 300 MG/ML  SOLN
100.0000 mL | Freq: Once | INTRAMUSCULAR | Status: AC | PRN
  Administered 2022-06-24: 100 mL via INTRAVENOUS

## 2022-06-24 MED ORDER — HALOPERIDOL LACTATE 5 MG/ML IJ SOLN
5.0000 mg | Freq: Once | INTRAMUSCULAR | Status: AC
  Administered 2022-06-24: 5 mg via INTRAVENOUS
  Filled 2022-06-24: qty 1

## 2022-06-24 MED ORDER — FAMOTIDINE 20 MG PO TABS: 20.0000 mg | ORAL_TABLET | Freq: Every day | ORAL | 1 refills | Status: DC

## 2022-06-24 NOTE — Discharge Instructions (Addendum)
Please seek medical attention for any high fevers, chest pain, shortness of breath, change in behavior, persistent vomiting, bloody stool or any other new or concerning symptoms.  

## 2022-06-24 NOTE — ED Notes (Signed)
Delay in d/c d/t shift change and influx into department

## 2022-06-24 NOTE — ED Provider Notes (Signed)
[facility] Provider Note    Event Date/Time   First MD Initiated Contact with Patient 06/24/22 [phone]     (approximate)   History   Abdominal Pain   HPI  Lauren Stephens is a 31 y.o. female  who presents to the emergency department because of concern for abdominal pain. Pain started 3 days ago. Located in the epigastric region. Has been accompanied by some nausea and vomiting as well as diarrhea. Pain will wax and wane slightly. Denies similar pain in the past. Denies any fevers.    Physical Exam   Triage Vital Signs: ED Triage Vitals  Enc Vitals Group     BP 06/24/22 0048 118/76     Pulse Rate 06/24/22 0048 63     Resp 06/24/22 0048 16     Temp 06/24/22 0048 98.5 F (36.9 C)     Temp Source 06/24/22 0048 Oral     SpO2 06/24/22 0048 98 %     Weight 06/24/22 0023 212 lb (96.2 kg)     Height 06/24/22 0023 '5\' 1"'$  (1.549 m)     Head Circumference --      Peak Flow --      Pain Score 06/24/22 0023 10     Pain Loc --      Pain Edu? --      Excl. in [place]? --     Most recent vital signs: Vitals:   06/24/22 0048  BP: 118/76  Pulse: 63  Resp: 16  Temp: 98.5 F (36.9 C)  SpO2: 98%    General: Awake, alert, oriented. CV:  Good peripheral perfusion. Regular rate and rhythm. Resp:  Normal effort. Lungs clear. Abd:  No distention. Tender to palpation in the epigastric region.   ED Results / Procedures / Treatments   Labs (all labs ordered are listed, but only abnormal results are displayed) Labs Reviewed  CBC WITH DIFFERENTIAL/PLATELET - Abnormal; Notable for the following components:      Result Value   WBC 17.7 (*)    RBC 5.37 (*)    Neutro Abs 13.1 (*)    Monocytes Absolute 1.2 (*)    All other components within normal limits  COMPREHENSIVE METABOLIC PANEL - Abnormal; Notable for the following components:   Glucose, Bld 114 (*)    Total Protein 8.4 (*)    All other components within normal limits  URINALYSIS, ROUTINE W REFLEX  MICROSCOPIC - Abnormal; Notable for the following components:   Color, Urine AMBER (*)    APPearance CLOUDY (*)    Specific Gravity, Urine 1.038 (*)    Ketones, ur 20 (*)    Protein, ur 100 (*)    Leukocytes,Ua TRACE (*)    All other components within normal limits  LIPASE, BLOOD  HCG, QUANTITATIVE, PREGNANCY  POC URINE PREG, ED     EKG  None   RADIOLOGY I independently interpreted and visualized the CT abd/pel. My interpretation: No free air Radiology interpretation:  IMPRESSION:  1. No acute intra-abdominal pathology identified. No definite  radiographic explanation for the patient's reported symptoms.      PROCEDURES:  Critical Care performed: No  Procedures   MEDICATIONS ORDERED IN ED: Medications  ondansetron (ZOFRAN-ODT) disintegrating tablet 4 mg (4 mg Oral Given 06/24/22 0054)  iohexol (OMNIPAQUE) 300 MG/ML solution 100 mL (100 mLs Intravenous Contrast Given 06/24/22 0355)     IMPRESSION / MDM / ASSESSMENT AND PLAN / ED COURSE  I reviewed the triage vital signs  and the nursing notes.                              Differential diagnosis includes, but is not limited to, pancreatitis, gastritis, hepatitis, gallbladder disease.  Patient's presentation is most consistent with acute presentation with potential threat to life or bodily function.  Patient presented to the emergency department today because of concerns for abdominal pain.  On exam patient was tender in the epigastric region.  Blood work did have a leukocytosis.  CT scan was obtained which not show any acute concerning abnormality.  Did try GI cocktail which gave some relief but patient still had significant pain.  Then tried Haldol which patient said helped the pain significantly.  At this time given reassuring imaging and clinical improvement I think it is reasonable for patient be discharged home.  Will discharge with antacid and Bentyl.  FINAL CLINICAL IMPRESSION(S) / ED DIAGNOSES   Final  diagnoses:  Abdominal pain, unspecified abdominal location    Note:  This document was prepared using Dragon voice recognition software and may include unintentional dictation errors.    Nance Pear, MD 06/24/22 [phone]

## 2022-06-24 NOTE — ED Triage Notes (Signed)
Patient ambulatory to triage with steady gait, without difficulty or distress noted; pt reports since yesterday having N/V and abd pain

## 2023-01-23 ENCOUNTER — Ambulatory Visit (LOCAL_COMMUNITY_HEALTH_CENTER): Payer: Medicaid Other

## 2023-01-23 VITALS — BP 134/78 | Ht 61.0 in | Wt 209.5 lb

## 2023-01-23 DIAGNOSIS — Z3201 Encounter for pregnancy test, result positive: Secondary | ICD-10-CM

## 2023-01-23 DIAGNOSIS — Z3009 Encounter for other general counseling and advice on contraception: Secondary | ICD-10-CM

## 2023-01-23 LAB — PREGNANCY, URINE: Preg Test, Ur: POSITIVE — AB

## 2023-01-23 MED ORDER — PRENATAL 27-0.8 MG PO TABS: 1.0000 | ORAL_TABLET | Freq: Every day | ORAL | 0 refills | Status: DC

## 2023-01-23 NOTE — Progress Notes (Signed)
UPT postive. Plans prenatal care at [facility]. Positive preg packet given and reviewed.   Patient states she was tx for BV 10/2022 at [facility] and uses Nystatin cream and Kenalog cream prn itching. Last use per pt was "few days ago." Denies discharge and dysuria.  Consult E Sciora, CNM who explains that nystatin cream and Kenalog cream is safe in pregnancy.   RN explained this to patient.   The patient was dispensed prenatal vitamins #100 today per [facility] Dr Ralene Bathe. I provided counseling today regarding the medication. We discussed the medication, the side effects and when to call clinic. Patient given the opportunity to ask questions. Questions answered.    Sent to clerk for pre admit. Jerel Shepherd, RN

## 2023-01-23 NOTE — Progress Notes (Signed)
UPT postive. Plans prenatal care at [facility]. Positive preg packet given and reviewed.   Patient states she was tx for BV 10/2022 at [facility] and uses Nystatin cream and Kenalog cream prn itching. Last use per pt was "few days ago." Denies discharge and dysuria.  Consult E Jocelynn Gioffre, CNM who explains that nystatin cream and Kenalog cream is safe in pregnancy.   RN explained this to patient.   The patient was dispensed prenatal vitamins #100 today per [facility] Dr Ralene Bathe. I provided counseling today regarding the medication. We discussed the medication, the side effects and when to call clinic. Patient given the opportunity to ask questions. Questions answered.    Sent to clerk for pre admit. Jerel Shepherd, RN  Consulted on the plan of care for this client.  I agree with the documented note and actions taken to provide care for this client.  Hazle Coca, CNM

## 2023-02-01 ENCOUNTER — Emergency Department: Payer: No Typology Code available for payment source

## 2023-02-01 ENCOUNTER — Other Ambulatory Visit: Payer: Self-pay

## 2023-02-01 DIAGNOSIS — Z3A12 12 weeks gestation of pregnancy: Secondary | ICD-10-CM | POA: Insufficient documentation

## 2023-02-01 DIAGNOSIS — O219 Vomiting of pregnancy, unspecified: Secondary | ICD-10-CM | POA: Insufficient documentation

## 2023-02-01 DIAGNOSIS — O26891 Other specified pregnancy related conditions, first trimester: Secondary | ICD-10-CM | POA: Diagnosis not present

## 2023-02-01 DIAGNOSIS — O3481 Maternal care for other abnormalities of pelvic organs, first trimester: Secondary | ICD-10-CM | POA: Diagnosis present

## 2023-02-01 LAB — COMPREHENSIVE METABOLIC PANEL
ALT: 44 U/L (ref 0–44)
AST: 27 U/L (ref 15–41)
Albumin: 4.6 g/dL (ref 3.5–5.0)
Alkaline Phosphatase: 65 U/L (ref 38–126)
Anion gap: 9 (ref 5–15)
BUN: 7 mg/dL (ref 6–20)
CO2: 23 mmol/L (ref 22–32)
Calcium: 9.2 mg/dL (ref 8.9–10.3)
Chloride: 104 mmol/L (ref 98–111)
Creatinine, Ser: 0.77 mg/dL (ref 0.44–1.00)
GFR, Estimated: >60 mL/min (ref >60)
Glucose, Bld: 97 mg/dL (ref 70–99)
Potassium: 3.5 mmol/L (ref 3.5–5.1)
Sodium: 136 mmol/L (ref 135–145)
Total Bilirubin: 0.6 mg/dL (ref 0.3–1.2)
Total Protein: 7.9 g/dL (ref 6.5–8.1)

## 2023-02-01 LAB — URINALYSIS, ROUTINE W REFLEX MICROSCOPIC
Bilirubin Urine: NEGATIVE
Glucose, UA: NEGATIVE mg/dL
Hgb urine dipstick: NEGATIVE
Ketones, ur: NEGATIVE mg/dL
Leukocytes,Ua: NEGATIVE
Nitrite: NEGATIVE
Protein, ur: NEGATIVE mg/dL
Specific Gravity, Urine: 1.015 (ref 1.005–1.030)
pH: 6 (ref 5.0–8.0)

## 2023-02-01 LAB — CBC
HCT: 43.1 % (ref 36.0–46.0)
Hemoglobin: 14.3 g/dL (ref 12.0–15.0)
MCH: 27 pg (ref 26.0–34.0)
MCHC: 33.2 g/dL (ref 30.0–36.0)
MCV: 81.5 fL (ref 80.0–100.0)
Platelets: 303 10*3/uL (ref 150–400)
RBC: 5.29 MIL/uL — ABNORMAL HIGH (ref 3.87–5.11)
RDW: 15.1 % (ref 11.5–15.5)
WBC: 15.4 10*3/uL — ABNORMAL HIGH (ref 4.0–10.5)
nRBC: 0 % (ref 0.0–0.2)

## 2023-02-01 LAB — LIPASE, BLOOD: Lipase: 37 U/L (ref 11–51)

## 2023-02-01 LAB — PREGNANCY, URINE: Preg Test, Ur: POSITIVE — AB

## 2023-02-01 LAB — HCG, QUANTITATIVE, PREGNANCY: hCG, Beta Chain, Quant, S: 43553 m[IU]/mL — ABNORMAL HIGH (ref <5)

## 2023-02-01 NOTE — ED Triage Notes (Addendum)
Pt to ED via POV c/o lower abd pain. Pt states it starts at umbulical area and goes down down to pelvic area. Reports being [redacted] weeks pregnant. Pain started tonight. Vomiting x1week. Denies any CP, SOB, fevers. No vaginal bleeding.

## 2023-02-02 ENCOUNTER — Emergency Department
Admission: EM | Admit: 2023-02-02 | Discharge: 2023-02-02 | Disposition: A | Discharge status: Home / Self Care | Payer: No Typology Code available for payment source | Attending: Emergency Medicine | Admitting: Emergency Medicine

## 2023-02-02 DIAGNOSIS — Z3491 Encounter for supervision of normal pregnancy, unspecified, first trimester: Secondary | ICD-10-CM

## 2023-02-02 DIAGNOSIS — O3481 Maternal care for other abnormalities of pelvic organs, first trimester: Secondary | ICD-10-CM | POA: Diagnosis not present

## 2023-02-02 DIAGNOSIS — O210 Mild hyperemesis gravidarum: Secondary | ICD-10-CM

## 2023-02-02 DIAGNOSIS — N83201 Unspecified ovarian cyst, right side: Secondary | ICD-10-CM

## 2023-02-02 MED ORDER — ONDANSETRON 4 MG PO TBDP: 4.0000 mg | ORAL_TABLET | Freq: Three times a day (TID) | ORAL | 0 refills | Status: DC | PRN

## 2023-02-02 MED ORDER — ONDANSETRON 4 MG PO TBDP
4.0000 mg | ORAL_TABLET | Freq: Once | ORAL | Status: AC
  Administered 2023-02-02: 4 mg via ORAL
  Filled 2023-02-02: qty 1

## 2023-02-02 NOTE — Discharge Instructions (Signed)
You may take Zofran as needed for nausea/vomiting.  Return to the ER for worsening symptoms, persistent vomiting, vaginal bleeding or other concerns.

## 2023-02-02 NOTE — ED Provider Notes (Signed)
[facility] Provider Note    Event Date/Time   First MD Initiated Contact with Patient 02/02/23 [phone]     (approximate)   History   Abdominal Pain   HPI  Lauren Stephens is a 32 y.o. female G3 P2 approximately [redacted] weeks pregnant by dates who presents to the emergency department with a chief complaint of pelvic pain.  Pain began approximately 8 PM while at work.  Reports intermittent nausea/vomiting "morning sickness" x 1 week.  Denies fever/chills, chest pain, shortness of breath, vaginal bleeding, STD concerns.     Past Medical History   Past Medical History:  Diagnosis Date   Bacterial vaginosis    10/2022  [facility]   Infertility, female    f-u [facility]     Active Problem List   Patient Active Problem List   Diagnosis Date Noted   Morbid obesity [facility]) BMI=42.3 08/01/2019   Procreative investigation and testing 02/08/2018   Insulin resistance 02/05/2018     Past Surgical History   Past Surgical History:  Procedure Laterality Date   NO PAST SURGERIES       Home Medications   Prior to Admission medications   Medication Sig Start Date End Date Taking? Authorizing Provider  ondansetron (ZOFRAN-ODT) 4 MG disintegrating tablet Take 1 tablet (4 mg total) by mouth every 8 (eight) hours as needed for nausea or vomiting. 02/02/23  Yes Irean Hong, MD  acetaminophen (TYLENOL) 325 MG tablet Take 2 tablets (650 mg total) by mouth every 4 (four) hours as needed (for pain scale < 4). Patient not taking: No sig reported 08/10/18   McVey, Prudencio Pair, CNM  dicyclomine (BENTYL) 10 MG capsule Take 1 capsule (10 mg total) by mouth 3 (three) times daily as needed for spasms. Patient not taking: Reported on 01/23/2023 06/24/22   Phineas Semen, MD  famotidine (PEPCID) 20 MG tablet Take 1 tablet (20 mg total) by mouth daily. Patient not taking: Reported on 01/23/2023 06/24/22 06/24/23  Phineas Semen, MD  ferrous sulfate 325 (65 FE) MG  tablet Take 1 tablet (325 mg total) by mouth 2 (two) times daily with a meal. Patient not taking: No sig reported 08/10/18   McVey, Prudencio Pair, CNM  ibuprofen (ADVIL,MOTRIN) 600 MG tablet Take 1 tablet (600 mg total) by mouth every 6 (six) hours. Patient not taking: No sig reported 08/10/18   McVey, Lurena Joiner A, CNM  Multiple Vitamin (MULTIVITAMIN) tablet Take 1 tablet by mouth daily. Patient not taking: Reported on 01/23/2023    [provider]  Prenatal Vit-Fe Fumarate-FA (MULTIVITAMIN-PRENATAL) 27-0.8 MG TABS tablet Take 1 tablet by mouth daily at 12 noon. 01/23/23 05/03/23  Federico Flake, MD  Prenatal Vit-Fe Fumarate-FA (PRENATAL MULTIVITAMIN) TABS tablet Take 1 tablet by mouth daily at 12 noon. Patient not taking: No sig reported    [provider]  senna-docusate (SENOKOT-S) 8.6-50 MG tablet Take 2 tablets by mouth daily. Patient not taking: No sig reported 08/10/18   McVey, Prudencio Pair, CNM     Allergies  Patient has no known allergies.   Family History   Family History  Problem Relation Age of Onset   Arthritis Mother    Migraines Mother    Asthma Sister    Arthritis Maternal Grandmother    Diabetes Maternal Grandmother    Asthma Half-Brother    Asthma Child      Physical Exam  Triage Vital Signs: ED Triage Vitals [02/01/23 2110]  Enc Vitals Group  BP 124/72     Pulse Rate 68     Resp 18     Temp 98.6 F (37 C)     Temp Source Oral     SpO2 100 %     Weight 209 lb (94.8 kg)     Height 5\' 1"  (1.549 m)     Head Circumference      Peak Flow      Pain Score 8     Pain Loc      Pain Edu?      Excl. in [place]?     Updated Vital Signs: BP (!) 136/90 (BP Location: Right Arm)   Pulse 68   Temp 98.7 F (37.1 C) (Oral)   Resp 16   Ht 5\' 1"  (1.549 m)   Wt 94.8 kg   LMP 11/04/2022 (Within Weeks)   SpO2 100%   BMI 39.49 kg/m    General: Awake, no distress.  CV:  RRR.  Good peripheral perfusion.  Resp:  Normal effort.   CTAB. Abd:  Nontender.  No distention.  Other:  No truncal vesicles.  ED Results / Procedures / Treatments  Labs (all labs ordered are listed, but only abnormal results are displayed) Labs Reviewed  CBC - Abnormal; Notable for the following components:      Result Value   WBC 15.4 (*)    RBC 5.29 (*)    All other components within normal limits  URINALYSIS, ROUTINE W REFLEX MICROSCOPIC - Abnormal; Notable for the following components:   Color, Urine YELLOW (*)    APPearance CLEAR (*)    All other components within normal limits  HCG, QUANTITATIVE, PREGNANCY - Abnormal; Notable for the following components:   hCG, Beta Chain, Quant, S 43,553 (*)    All other components within normal limits  PREGNANCY, URINE - Abnormal; Notable for the following components:   Preg Test, Ur POSITIVE (*)    All other components within normal limits  LIPASE, BLOOD  COMPREHENSIVE METABOLIC PANEL     EKG  None   RADIOLOGY Independently visualized and interpreted patient's ultrasound as well as noted the radiology interpretation:  Ultrasound: IUP approximately 6 weeks 1 day, right ovarian cyst  Official radiology report(s): US OB LESS THAN 14 WEEKS WITH OB TRANSVAGINAL  Result Date: 02/02/2023 CLINICAL DATA:  Pain. EXAM: OBSTETRIC <14 WK [place] AND TRANSVAGINAL OB US TECHNIQUE: Both transabdominal and transvaginal ultrasound examinations were performed for complete evaluation of the gestation as well as the maternal uterus, adnexal regions, and pelvic cul-de-sac. Transvaginal technique was performed to assess early pregnancy. COMPARISON:  None Available. FINDINGS: Intrauterine gestational sac: Single Yolk sac:  Visualized. Embryo:  Visualized. Cardiac Activity: Visualized. Heart Rate: 119 bpm CRL:  4.3 mm   6 w   1 d                  [place] EDC: September 26, 2023 Subchorionic hemorrhage:  None visualized. Maternal uterus/adnexae: A 2.4 cm x 1.9 cm x 2.0 cm cyst is seen within an otherwise normal appearing  right ovary. The left ovary is visualized and is normal in appearance. No pelvic free fluid is seen. IMPRESSION: Single, viable intrauterine pregnancy at approximately 6 weeks and 1 day gestation by ultrasound evaluation. Electronically Signed   By: Aram Candela M.D.   On: 02/02/2023 00:30     PROCEDURES:  Critical Care performed: No  Procedures   MEDICATIONS ORDERED IN ED: Medications  ondansetron (ZOFRAN-ODT) disintegrating tablet 4 mg (4 mg  Oral Given 02/02/23 0246)     IMPRESSION / MDM / ASSESSMENT AND PLAN / ED COURSE  I reviewed the triage vital signs and the nursing notes.                             32 year old G3, P1 who presents with pelvic pain. Differential diagnosis includes, but is not limited to, ovarian cyst, ovarian torsion, acute appendicitis, diverticulitis, urinary tract infection/pyelonephritis, endometriosis, bowel obstruction, colitis, renal colic, gastroenteritis, hernia, fibroids, endometriosis, pregnancy related pain including ectopic pregnancy, etc. I personally reviewed patient's records and note a health department visit visit on 01/23/2023 for positive pregnancy test.  Patient's presentation is most consistent with acute presentation with potential threat to life or bodily function.  Laboratory results demonstrate normal hemoglobin 14, normal electrolytes, negative urine.  Beta-hCG greater than 43,000.  Ultrasound demonstrates IUP, right ovarian cyst.  Will discharge home with prescription for Zofran to use as needed, and patient will follow-up with her OB/GYN as needed.  Strict return precautions given.  Patient verbalizes understanding and agrees with plan of care.      FINAL CLINICAL IMPRESSION(S) / ED DIAGNOSES   Final diagnoses:  Cyst of right ovary  First trimester pregnancy  Morning sickness     Rx / DC Orders   ED Discharge Orders          Ordered    ondansetron (ZOFRAN-ODT) 4 MG disintegrating tablet  Every 8 hours PRN         02/02/23 0233             Note:  This document was prepared using Dragon voice recognition software and may include unintentional dictation errors.   Irean Hong, MD 02/02/23 [phone]

## 2023-02-07 ENCOUNTER — Encounter: Payer: Self-pay | Admitting: Family Medicine

## 2023-02-07 ENCOUNTER — Ambulatory Visit: Payer: Medicaid Other | Admitting: Family Medicine

## 2023-02-07 VITALS — BP 101/65 | HR 50 | Temp 97.3°F | Wt 208.0 lb

## 2023-02-07 DIAGNOSIS — Z3480 Encounter for supervision of other normal pregnancy, unspecified trimester: Secondary | ICD-10-CM

## 2023-02-07 DIAGNOSIS — N83209 Unspecified ovarian cyst, unspecified side: Secondary | ICD-10-CM

## 2023-02-07 DIAGNOSIS — Z23 Encounter for immunization: Secondary | ICD-10-CM | POA: Diagnosis not present

## 2023-02-07 DIAGNOSIS — O99210 Obesity complicating pregnancy, unspecified trimester: Secondary | ICD-10-CM

## 2023-02-07 DIAGNOSIS — R112 Nausea with vomiting, unspecified: Secondary | ICD-10-CM

## 2023-02-07 DIAGNOSIS — B3731 Acute candidiasis of vulva and vagina: Secondary | ICD-10-CM

## 2023-02-07 DIAGNOSIS — O3481 Maternal care for other abnormalities of pelvic organs, first trimester: Secondary | ICD-10-CM

## 2023-02-07 LAB — WET PREP FOR TRICH, YEAST, CLUE
Trichomonas Exam: NEGATIVE
Yeast Exam: NEGATIVE

## 2023-02-07 LAB — HEMOGLOBIN, FINGERSTICK: Hemoglobin: 14.3 g/dL (ref 11.1–15.9)

## 2023-02-07 MED ORDER — CLOTRIMAZOLE-BETAMETHASONE 1-0.05 % EX CREA
1.0000 | TOPICAL_CREAM | Freq: Every day | CUTANEOUS | 0 refills | Status: DC
Start: 2023-02-07 — End: 2023-02-07

## 2023-02-07 MED ORDER — PROMETHAZINE HCL 25 MG PO TABS
12.5000 mg | ORAL_TABLET | Freq: Three times a day (TID) | ORAL | 0 refills | Status: DC | PRN
Start: 2023-02-07 — End: 2023-02-07

## 2023-02-07 MED ORDER — CLOTRIMAZOLE-BETAMETHASONE 1-0.05 % EX CREA
1.0000 | TOPICAL_CREAM | Freq: Every day | CUTANEOUS | 0 refills | Status: DC
Start: 2023-02-07 — End: 2023-08-12

## 2023-02-07 MED ORDER — PROMETHAZINE HCL 25 MG PO TABS
12.5000 mg | ORAL_TABLET | ORAL | 0 refills | Status: DC | PRN
Start: 2023-02-07 — End: 2023-08-12

## 2023-02-07 NOTE — Progress Notes (Signed)
Here today for 13.4 week MH IP appt. Taking PNV every day as well as Zofran every day for N/V. Went to [facility] ED 02/02/23 for N/V and abdominal pain. Had [place] which gave an EDC of 09/26/2023. (6.6 weeks today.) Wants Mat21 bloodwork and Flu vaccine today. Tawny Hopping, RN

## 2023-02-07 NOTE — Progress Notes (Signed)
[facility]  Maternal Health Clinic   INITIAL PRENATAL VISIT NOTE  Subjective:  Lauren Stephens is a 32 y.o. G3P2002 at [redacted]w[redacted]d being seen today to start prenatal care at the [facility] Department.  She is currently monitored for the following issues for this no complaints pregnancy and has Insulin resistance; Morbid obesity (HCC) BMI=42.3; Nausea and vomiting; Supervision of other normal pregnancy, antepartum; Ovarian cyst during pregnancy; and Maternal obesity without hypertension on their problem list.  Patient reports nausea and vomiting.  Contractions: Not present. Vag. Bleeding: None.  Movement: Absent. Denies leaking of fluid.   Indications for ASA therapy (per uptodate) One of the following: Previous pregnancy with preeclampsia, especially early onset and with an adverse outcome No Multifetal gestation No Chronic hypertension No Type 1 or 2 diabetes mellitus No Chronic kidney disease No Autoimmune disease (antiphospholipid syndrome, systemic lupus erythematosus) No  Two or more of the following: Nulliparity No Obesity (body mass index >30 kg/m2) Yes Family history of preeclampsia in mother or sister No Age ?35 years No Sociodemographic characteristics (African American race, low socioeconomic level) Yes Personal risk factors (eg, previous pregnancy with low birth weight or small for gestational age infant, previous adverse pregnancy outcome [eg, stillbirth], interval >10 years between pregnancies) No   The following portions of the patient's history were reviewed and updated as appropriate: allergies, current medications, past family history, past medical history, past social history, past surgical history and problem list. Problem list updated.  Objective:   Vitals:   02/07/23 0833  BP: 101/65  Pulse: (!) 50  Temp: (!) 97.3 F (36.3 C)  Weight: 208 lb (94.3 kg)    Fetal Status:     Movement: Absent      Physical Exam Vitals  and nursing note reviewed.  Constitutional:      General: She is not in acute distress.    Appearance: She is well-developed. She is obese.  HENT:     Head: Normocephalic and atraumatic.     Right Ear: External ear normal.     Left Ear: External ear normal.     Nose: Nose normal. No congestion or rhinorrhea.     Mouth/Throat:     Comments: Practitioner oversight- forgot to look in mouth Eyes:     General: No scleral icterus.    Conjunctiva/sclera: Conjunctivae normal.  Neck:     Thyroid: No thyroid mass or thyromegaly.  Cardiovascular:     Rate and Rhythm: Normal rate.     Pulses: Normal pulses.     Comments: Extremities are warm and well perfused Pulmonary:     Effort: Pulmonary effort is normal.     Breath sounds: Normal breath sounds.  Chest:     Chest wall: No mass.  Breasts:    Tanner Score is 5.     Breasts are symmetrical.     Right: Normal. No mass, nipple discharge or skin change.     Left: Normal. No mass, nipple discharge or skin change.  Abdominal:     General: Abdomen is flat.     Palpations: Abdomen is soft.     Tenderness: There is no abdominal tenderness.     Comments: Gravid   Genitourinary:    General: Normal vulva.     Exam position: Lithotomy position.     Pubic Area: No rash.      Labia:        Right: No rash.        Left:  No rash.      Vagina: Normal. No vaginal discharge.     Cervix: No cervical motion tenderness or friability.     Uterus: Normal. Enlarged (Gravid 6-8 size). Not tender.      Adnexa: Right adnexa normal and left adnexa normal.     Rectum: Normal. No external hemorrhoid.  Musculoskeletal:     Right lower leg: No edema.     Left lower leg: No edema.  Lymphadenopathy:     Cervical: No cervical adenopathy.     Upper Body:     Right upper body: No axillary adenopathy.     Left upper body: No axillary adenopathy.  Skin:    General: Skin is warm.     Capillary Refill: Capillary refill takes less than 2 seconds.  Neurological:      Mental Status: She is alert.     Assessment and Plan:  Pregnancy: G3P2002 at [redacted]w[redacted]d  1. Supervision of other normal pregnancy, antepartum 32 year old female in clinic today for prenatal care. Lives with 2 children; 4 and 8 -dating [place] on 02/01/23 in ER @ [redacted]w[redacted]d  -Patient states she is taking PNV daily. -PAP= 07/29/20= NILM  -Denies alcohol and drug use.  UDS performed today.   -Agrees to Maternti21 at RV when 11 weeks  -Patient states she received COVID vaccines in the past  Patient to receive Flu vaccine today.  -PHQ-9= 4, will continue to monitor.  -Hgb= 14.3   - Prenatal Profile I- 144053 - QuantiFERON-TB Gold Plus - Glucose, 1 hour gestational - Hgb A1c w/o eAG - Protein / creatinine ratio, urine  (Spot) - TSH - WET PREP FOR TRICH, YEAST, CLUE - 098119 7+Oxycodone-Bund - Hemoglobin, fingerstick  2. Nausea and vomiting, unspecified vomiting type -Patient reports lots nausea and vomiting.  Patient encouraged to eat meals high in protein every 2 hours for nausea and also given resource sheet.  -Rx today for phenergan, has zofran from ER- it is helping a little  - promethazine (PHENERGAN) 25 MG tablet; Take 0.5 tablets (12.5 mg total) by mouth every 4 (four) hours as needed for nausea or vomiting. Take 1/2 to 1 tablet Q 4-6 hrs as needed for nausea and vomiting  Dispense: 15 tablet; Refill: 0  3. Ovarian cyst during pregnancy in first trimester -seen on [place] in ER 6/12  4. Maternal obesity without hypertension -discussed ASA recommendation- accepts, will start at 12 weeks on 03/14/23 -discussed exercise -weight gain of 11-20 # -will need anesthesia consult around 32 weeks  5. Vulval candidiasis -swelling and itching on left labia- counseled to put cream on outside -use cotton underwear  - clotrimazole-betamethasone (LOTRISONE) cream; Apply 1 Application topically daily.  Dispense: 30 g; Refill: 0  6. Morbid obesity (HCC) BMI=43.48 -due to pre-gravid BMI: Q4 week growth  scans starting at 28 weeks until delivery -NST biweekly or weekly AFI starting at 36 weeks    Discussed overview of care and coordination with inpatient delivery practices including  OB/GYN,  [facility].   Preterm labor symptoms and general obstetric precautions including but not limited to vaginal bleeding, contractions, leaking of fluid and fetal movement were reviewed in detail with the patient.  Please refer to After Visit Summary for other counseling recommendations.   Return in about 4 weeks (around 03/07/2023) for Routine Prenatal Care.  No future appointments.  Lenice Llamas, [place]

## 2023-02-07 NOTE — Progress Notes (Signed)
Hgb 14.3. [employer] results negative. Clotrimazole given per provider orders. Tawny Hopping, RN

## 2023-02-08 LAB — 789231 7+OXYCODONE-BUND
Amphetamines, Urine: NEGATIVE ng/mL
BENZODIAZ UR QL: NEGATIVE ng/mL
Barbiturate screen, urine: NEGATIVE ng/mL
Cannabinoid Quant, Ur: NEGATIVE ng/mL
Cocaine (Metab.): NEGATIVE ng/mL
OPIATE SCREEN URINE: NEGATIVE ng/mL
Oxycodone/Oxymorphone, Urine: NEGATIVE ng/mL
PCP Quant, Ur: NEGATIVE ng/mL

## 2023-02-14 ENCOUNTER — Encounter: Payer: Self-pay | Admitting: Family Medicine

## 2023-02-14 ENCOUNTER — Telehealth: Payer: Self-pay | Admitting: Family Medicine

## 2023-02-14 ENCOUNTER — Other Ambulatory Visit: Payer: Self-pay | Admitting: Family Medicine

## 2023-02-14 DIAGNOSIS — O2341 Unspecified infection of urinary tract in pregnancy, first trimester: Secondary | ICD-10-CM

## 2023-02-14 DIAGNOSIS — O09899 Supervision of other high risk pregnancies, unspecified trimester: Secondary | ICD-10-CM | POA: Insufficient documentation

## 2023-02-14 DIAGNOSIS — O2340 Unspecified infection of urinary tract in pregnancy, unspecified trimester: Secondary | ICD-10-CM | POA: Insufficient documentation

## 2023-02-14 LAB — PREGNANCY, INITIAL SCREEN
Antibody Screen: NEGATIVE
Basophils Absolute: 0.1 10*3/uL (ref 0.0–0.2)
Basos: 0 %
Bilirubin, UA: NEGATIVE
Chlamydia trachomatis, NAA: NEGATIVE
EOS (ABSOLUTE): 0.1 10*3/uL (ref 0.0–0.4)
Eos: 1 %
Glucose, UA: NEGATIVE
HCV Ab: NONREACTIVE
HIV Screen 4th Generation wRfx: NONREACTIVE
Hematocrit: 43.6 % (ref 34.0–46.6)
Hemoglobin: 14 g/dL (ref 11.1–15.9)
Hepatitis B Surface Ag: NEGATIVE
Immature Grans (Abs): 0 10*3/uL (ref 0.0–0.1)
Immature Granulocytes: 0 %
Ketones, UA: NEGATIVE
Lymphocytes Absolute: 2.8 10*3/uL (ref 0.7–3.1)
Lymphs: 20 %
MCH: 27.1 pg (ref 26.6–33.0)
MCHC: 32.1 g/dL (ref 31.5–35.7)
MCV: 85 fL (ref 79–97)
Monocytes Absolute: 0.6 10*3/uL (ref 0.1–0.9)
Monocytes: 4 %
Neisseria Gonorrhoeae by PCR: NEGATIVE
Neutrophils Absolute: 10.2 10*3/uL — ABNORMAL HIGH (ref 1.4–7.0)
Neutrophils: 75 %
Nitrite, UA: POSITIVE — AB
Platelets: 272 10*3/uL (ref 150–450)
Protein,UA: NEGATIVE
RBC, UA: NEGATIVE
RBC: 5.16 x10E6/uL (ref 3.77–5.28)
RDW: 15 % (ref 11.7–15.4)
RPR Ser Ql: NONREACTIVE
Rh Factor: POSITIVE
Rubella Antibodies, IGG: <0.9 index — ABNORMAL LOW (ref >0.99)
Specific Gravity, UA: >=1.03 — AB (ref 1.005–1.030)
Urobilinogen, Ur: 1 mg/dL (ref 0.2–1.0)
WBC: 13.9 10*3/uL — ABNORMAL HIGH (ref 3.4–10.8)
pH, UA: 8.5 — ABNORMAL HIGH (ref 5.0–7.5)

## 2023-02-14 LAB — HGB A1C W/O EAG: Hgb A1c MFr Bld: 5.6 % (ref 4.8–5.6)

## 2023-02-14 LAB — MICROSCOPIC EXAMINATION
Casts: NONE SEEN /lpf
RBC, Urine: NONE SEEN /hpf (ref 0–2)

## 2023-02-14 LAB — QUANTIFERON-TB GOLD PLUS
QuantiFERON Mitogen Value: >10 IU/mL
QuantiFERON Nil Value: 0.12 IU/mL
QuantiFERON TB1 Ag Value: 0.16 IU/mL
QuantiFERON TB2 Ag Value: 0.17 IU/mL
QuantiFERON-TB Gold Plus: NEGATIVE

## 2023-02-14 LAB — URINE CULTURE, OB REFLEX

## 2023-02-14 LAB — TSH: TSH: 1.73 u[IU]/mL (ref 0.450–4.500)

## 2023-02-14 LAB — HCV INTERPRETATION

## 2023-02-14 LAB — GLUCOSE, 1 HOUR GESTATIONAL: Gestational Diabetes Screen: 98 mg/dL (ref 70–139)

## 2023-02-14 MED ORDER — AMOXICILLIN 875 MG PO TABS
875.0000 mg | ORAL_TABLET | Freq: Two times a day (BID) | ORAL | 0 refills | Status: AC
Start: 2023-02-14 — End: 2023-02-21

## 2023-02-14 NOTE — Telephone Encounter (Signed)
Called patient and informed her of UTI on urine test. Discussed that a prescription would be sent to the [facility] on [address]. Pt's questions answered.  [facility] FNP-C

## 2023-03-02 LAB — COMPREHENSIVE METABOLIC PANEL
ALT: 26 IU/L (ref 0–32)
AST: 16 IU/L (ref 0–40)
Albumin: 4.5 g/dL (ref 4.0–5.0)
Alkaline Phosphatase: 85 IU/L (ref 44–121)
BUN/Creatinine Ratio: 6 — ABNORMAL LOW (ref 9–23)
BUN: 5 mg/dL — ABNORMAL LOW (ref 6–20)
Bilirubin Total: 0.4 mg/dL (ref 0.0–1.2)
CO2: 21 mmol/L (ref 20–29)
Calcium: 9.6 mg/dL (ref 8.7–10.2)
Chloride: 100 mmol/L (ref 96–106)
Creatinine, Ser: 0.8 mg/dL (ref 0.57–1.00)
Globulin, Total: 2.4 g/dL (ref 1.5–4.5)
Glucose: 108 mg/dL — ABNORMAL HIGH (ref 70–99)
Potassium: 3.6 mmol/L (ref 3.5–5.2)
Sodium: 139 mmol/L (ref 134–144)
Total Protein: 6.9 g/dL (ref 6.0–8.5)
eGFR: 102 mL/min/{1.73_m2} (ref >59)

## 2023-03-02 LAB — SPECIMEN STATUS REPORT

## 2023-03-07 ENCOUNTER — Ambulatory Visit: Payer: Medicaid Other | Admitting: Advanced Practice Midwife

## 2023-03-07 VITALS — BP 106/73 | HR 68 | Temp 98.1°F | Wt 213.4 lb

## 2023-03-07 DIAGNOSIS — Z3490 Encounter for supervision of normal pregnancy, unspecified, unspecified trimester: Secondary | ICD-10-CM

## 2023-03-07 DIAGNOSIS — Z3480 Encounter for supervision of other normal pregnancy, unspecified trimester: Secondary | ICD-10-CM

## 2023-03-07 DIAGNOSIS — O99210 Obesity complicating pregnancy, unspecified trimester: Secondary | ICD-10-CM

## 2023-03-07 DIAGNOSIS — O09899 Supervision of other high risk pregnancies, unspecified trimester: Secondary | ICD-10-CM

## 2023-03-07 DIAGNOSIS — O2341 Unspecified infection of urinary tract in pregnancy, first trimester: Secondary | ICD-10-CM

## 2023-03-07 DIAGNOSIS — O99211 Obesity complicating pregnancy, first trimester: Secondary | ICD-10-CM

## 2023-03-07 DIAGNOSIS — Z2839 Other underimmunization status: Secondary | ICD-10-CM

## 2023-03-07 DIAGNOSIS — Z3481 Encounter for supervision of other normal pregnancy, first trimester: Secondary | ICD-10-CM

## 2023-03-07 NOTE — Progress Notes (Signed)
Patient here for MH RV at 11 weeks. Finished UTI medication, needs urine TOC. Desires Maternit21 today. Counseled Rubella non-immune, and need for 1 more MMR after delivery. C/O lower abdominal pain, states had left lower abdominal pain on Saturday night. States it was quite painful.Burt Knack, RN

## 2023-03-07 NOTE — Progress Notes (Addendum)
[facility] Health Department Maternal Health Clinic  PRENATAL VISIT NOTE  Subjective:  Lauren Stephens is a 32 y.o. G3P2002 at 110w0d being seen today for ongoing prenatal care.  She is currently monitored for the following issues for this low-risk pregnancy and has Insulin resistance; Morbid obesity (HCC) BMI=42.3; Nausea and vomiting; Supervision of other normal pregnancy, antepartum; Ovarian cyst during pregnancy; Maternal obesity without hypertension; Rubella non-immune status, antepartum; and UTI (urinary tract infection) during pregnancy Proteus mirabilis 02/07/23 on their problem list.  Patient reports no complaints.  Contractions: Not present. Vag. Bleeding: None.  Movement: Absent. Denies leaking of fluid/ROM.   The following portions of the patient's history were reviewed and updated as appropriate: allergies, current medications, past family history, past medical history, past social history, past surgical history and problem list. Problem list updated.  Objective:   Vitals:   03/07/23 1034  BP: 106/73  Pulse: 68  Temp: 98.1 F (36.7 C)  Weight: 213 lb 6.4 oz (96.8 kg)    Fetal Status: Fetal Heart Rate (bpm): 160 Fundal Height: 14 cm Movement: Absent     General:  Alert, oriented and cooperative. Patient is in no acute distress.  Skin: Skin is warm and dry. No rash noted.   Cardiovascular: Normal heart rate noted  Respiratory: Normal respiratory effort, no problems with respiration noted  Abdomen: Soft, gravid, appropriate for gestational age.  Pain/Pressure: Absent     Pelvic: Cervical exam deferred        Extremities: Normal range of motion.  Edema: None  Mental Status: Normal mood and affect. Normal behavior. Normal judgment and thought content.   Assessment and Plan:  Pregnancy: G3P2002 at [redacted]w[redacted]d  1. Maternal obesity without hypertension Plans to buy ASA 81 mg and begin taking at 12 wks -16 lb 9.6 oz (-7.53 kg) 5 lb wt gain in last 4 wks  2. Rubella  non-immune status, antepartum   3. UTI (urinary tract infection) during pregnancy Proteus mirabilis Dx'd 02/07/23; treated 02/14/23; TOC C&S today - Urine Culture   5. Supervision of other normal pregnancy, antepartum N&V resolved! Not exercising; encouraged to do so 3-4x/wk x 20 min 1 hour glucola=98 on 02/07/23 Not working States anxious and worried because this pregnancy is not like her other 2 and she needed fertility meds to help her ovulate Pt agrees to try counseling with Kathreen Cosier, LCSW and accepts contact card C/o occassional intermittent R or L adnexal pain which resolves spontanously after a few minutes; pt reassured and suggestions given Reviewed 02/01/23 u/s at 6 1/7 with IUP Will need anatomy u/s ordered at next apt Many questions answered  - MaterniT21 PLUS Core   Preterm labor symptoms and general obstetric precautions including but not limited to vaginal bleeding, contractions, leaking of fluid and fetal movement were reviewed in detail with the patient. Please refer to After Visit Summary for other counseling recommendations.  Return in about 4 weeks (around 04/04/2023) for routine PNC.  No future appointments.  Alberteen Spindle, CNM

## 2023-03-10 ENCOUNTER — Telehealth: Payer: Self-pay | Admitting: Family Medicine

## 2023-03-10 NOTE — Telephone Encounter (Signed)
Pt called about her blood work results she had done. She's a bit concerned + would like to know how long they'll take.

## 2023-03-10 NOTE — Telephone Encounter (Signed)
Return call to client who desires to know if MaterniT-21 test result is available. Counseled results are still pending and to call back next week on Monday or Tuesday. Jossie Ng, RN

## 2023-03-11 LAB — URINE CULTURE

## 2023-03-12 LAB — MATERNIT 21 PLUS CORE, BLOOD
Fetal Fraction: 5
Result (T21): NEGATIVE
Trisomy 13 (Patau syndrome): NEGATIVE
Trisomy 18 (Edwards syndrome): NEGATIVE
Trisomy 21 (Down syndrome): NEGATIVE

## 2023-03-13 ENCOUNTER — Telehealth: Payer: Self-pay

## 2023-03-13 NOTE — Addendum Note (Signed)
Addended by: Burt Knack on: 03/13/2023 12:09 PM   Modules accepted: Orders

## 2023-03-13 NOTE — Telephone Encounter (Signed)
TC to [facility] to request results for urine spot prot/creat. Per Bjorn Loser at [employer], she can not see an order or result for this lab. Burt Knack, RN

## 2023-03-14 ENCOUNTER — Telehealth: Payer: Self-pay

## 2023-03-14 NOTE — Telephone Encounter (Signed)
Call to client to notify her of need for Ceftriaxone to treat UTI. Appt scheduled for 03/15/23. At appt, client would like copy of MaterniT-21 results in sealed envelope. Does not want to know sex of baby at this time as gender reveal planned. Jossie Ng, RN

## 2023-03-15 ENCOUNTER — Ambulatory Visit: Payer: Medicaid Other | Admitting: Advanced Practice Midwife

## 2023-03-15 VITALS — BP 105/70 | HR 98 | Temp 98.0°F | Wt 213.4 lb

## 2023-03-15 DIAGNOSIS — O2341 Unspecified infection of urinary tract in pregnancy, first trimester: Secondary | ICD-10-CM

## 2023-03-15 DIAGNOSIS — Z3480 Encounter for supervision of other normal pregnancy, unspecified trimester: Secondary | ICD-10-CM

## 2023-03-15 DIAGNOSIS — Z3481 Encounter for supervision of other normal pregnancy, first trimester: Secondary | ICD-10-CM

## 2023-03-15 MED ORDER — CEFTRIAXONE SODIUM 500 MG IJ SOLR
500.0000 mg | Freq: Once | INTRAMUSCULAR | Status: AC
Start: 2023-03-15 — End: 2023-03-15
  Administered 2023-03-15: 500 mg via INTRAMUSCULAR

## 2023-03-15 NOTE — Progress Notes (Signed)
Plans to purchase Aspirin 81 mg today and begin taking every day.   Here for UTI treatment and counseled to remain in room 15 minutes after injection. Ceftriaxone 500 mg IM x1 per verbal order of E. Sciora CNM. Client tolerated injection with minimal complaint. Aware of next [facility] RV appt 04/04/23. Spot protein today as per E. Sciora CNM.Marland KitchenGiven sealed copy of MaternitT-21 results as plans gender reveal in the future. Jossie Ng, RN

## 2023-03-16 LAB — PROTEIN / CREATININE RATIO, URINE
Creatinine, Urine: 150.4 mg/dL
Protein, Ur: 7.9 mg/dL

## 2023-04-04 ENCOUNTER — Telehealth: Payer: Self-pay

## 2023-04-04 ENCOUNTER — Ambulatory Visit: Payer: Medicaid Other | Admitting: Family Medicine

## 2023-04-04 VITALS — BP 115/78 | HR 67 | Temp 98.2°F | Wt 213.6 lb

## 2023-04-04 DIAGNOSIS — O2341 Unspecified infection of urinary tract in pregnancy, first trimester: Secondary | ICD-10-CM

## 2023-04-04 DIAGNOSIS — O2342 Unspecified infection of urinary tract in pregnancy, second trimester: Secondary | ICD-10-CM

## 2023-04-04 DIAGNOSIS — Z3480 Encounter for supervision of other normal pregnancy, unspecified trimester: Secondary | ICD-10-CM

## 2023-04-04 DIAGNOSIS — Z3482 Encounter for supervision of other normal pregnancy, second trimester: Secondary | ICD-10-CM

## 2023-04-04 NOTE — Progress Notes (Signed)
[facility] Department Maternal Health Clinic  PRENATAL VISIT NOTE  Subjective:  Lauren Stephens is a 32 y.o. G3P2002 at [redacted]w[redacted]d being seen today for ongoing prenatal care.  She is currently monitored for the following issues for this low-risk pregnancy and has Insulin resistance; Morbid obesity (HCC) BMI=42.3; Nausea and vomiting; Supervision of other normal pregnancy, antepartum; Ovarian cyst during pregnancy; Maternal obesity without hypertension; Rubella non-immune status, antepartum; and UTI (urinary tract infection) during pregnancy Proteus mirabilis 02/07/23 on their problem list.  Patient reports headache.  Contractions: Not present. Vag. Bleeding: None.  Movement: Absent. Denies leaking of fluid/ROM.   The following portions of the patient's history were reviewed and updated as appropriate: allergies, current medications, past family history, past medical history, past social history, past surgical history and problem list. Problem list updated.  Objective:   Vitals:   04/04/23 1116  BP: 115/78  Pulse: 67  Temp: 98.2 F (36.8 C)  Weight: 213 lb 9.6 oz (96.9 kg)    Fetal Status: Fetal Heart Rate (bpm): 147 Fundal Height: 15 cm Movement: Absent     General:  Alert, oriented and cooperative. Patient is in no acute distress.  Skin: Skin is warm and dry. No rash noted.   Cardiovascular: Normal heart rate noted  Respiratory: Normal respiratory effort, no problems with respiration noted  Abdomen: Soft, gravid, appropriate for gestational age.  Pain/Pressure: Absent     Pelvic: Cervical exam deferred        Extremities: Normal range of motion.  Edema: None  Mental Status: Normal mood and affect. Normal behavior. Normal judgment and thought content.   Assessment and Plan:  Pregnancy: G3P2002 at [redacted]w[redacted]d  1. Supervision of other normal pregnancy, antepartum -reports HA at night- not taking anything -anatomy scan ordered- please schedule for 3-4 weeks  - AFP,  Serum, Open Spina Bifida - Urine Culture  2. Urinary tract infection in mother during first trimester of pregnancy -TOC today  - Urine Culture  3. Morbid obesity (HCC) BMI=42.3 -recommended ASA at initial PNV, wasn't taking at 03/15/23 visit- states she bought it and is now taking it -16 lb 6.4 oz (-7.439 kg) -exercise- walking some- encouraged to continue    Preterm labor symptoms and general obstetric precautions including but not limited to vaginal bleeding, contractions, leaking of fluid and fetal movement were reviewed in detail with the patient. Please refer to After Visit Summary for other counseling recommendations.  Return in about 4 weeks (around 05/02/2023) for Routine Prenatal Care.  Future Appointments  Date Time Provider Department Center  05/02/2023 10:00 AM [facility] PROVIDER [facility] None    Lenice Llamas, FNP

## 2023-04-04 NOTE — Telephone Encounter (Signed)
Phone call to patient to inform of scheduled anatomy scan. Instructed patient to arrive at 10:30 04/25/23 with a full bladder to the [facility]. Address of above given. Patient verbalized understanding of above. Tawny Hopping, RN

## 2023-04-04 NOTE — Progress Notes (Signed)
Here today for 15.0 week MH RV. Taking PNV and ASA every day. Denies ED/hospital visits since last RV. Complains of frequent headaches especially at bedtime. Urine TOC and AFP today. Tawny Hopping, RN

## 2023-04-07 ENCOUNTER — Telehealth: Payer: Self-pay | Admitting: Family Medicine

## 2023-04-07 ENCOUNTER — Other Ambulatory Visit: Payer: Self-pay | Admitting: Family Medicine

## 2023-04-07 DIAGNOSIS — O2342 Unspecified infection of urinary tract in pregnancy, second trimester: Secondary | ICD-10-CM

## 2023-04-07 MED ORDER — AMOXICILLIN-POT CLAVULANATE 875-125 MG PO TABS
1.0000 | ORAL_TABLET | Freq: Two times a day (BID) | ORAL | 0 refills | Status: DC
Start: 2023-04-07 — End: 2023-04-19

## 2023-04-07 NOTE — Progress Notes (Signed)
Called patient to discuss UTI on recent C/S done in clinic 04/04/23. Medications sent to pharmacy. Questions encouraged and answered.  1. Urinary tract infection in mother during second trimester of pregnancy  - amoxicillin-clavulanate (AUGMENTIN) 875-125 MG tablet; Take 1 tablet by mouth 2 (two) times daily.  Dispense: 14 tablet; Refill: 0  [facility] FNP-C

## 2023-04-07 NOTE — Telephone Encounter (Signed)
Called patient regarding UTI medication- see other note

## 2023-04-19 ENCOUNTER — Other Ambulatory Visit: Payer: Self-pay | Admitting: Family Medicine

## 2023-04-19 ENCOUNTER — Telehealth: Payer: Self-pay | Admitting: Family Medicine

## 2023-04-19 DIAGNOSIS — O2342 Unspecified infection of urinary tract in pregnancy, second trimester: Secondary | ICD-10-CM

## 2023-04-19 MED ORDER — AMOXICILLIN-POT CLAVULANATE 875-125 MG PO TABS: 1.0000 | ORAL_TABLET | Freq: Two times a day (BID) | ORAL | 0 refills | Status: DC

## 2023-04-19 NOTE — Telephone Encounter (Signed)
Patient states she has burning on urination; completed all but 3 pills given to her for UTI last week. Aliene Altes notified. Patient told we will call her back with instructions this afternoon. BTHIELE RN

## 2023-04-19 NOTE — Telephone Encounter (Signed)
Maternity Patient called asking to talk to someone because she is having a few issues. Did not explain

## 2023-04-19 NOTE — Progress Notes (Signed)
1. Urinary tract infection in mother during second trimester of pregnancy  - amoxicillin-clavulanate (AUGMENTIN) 875-125 MG tablet; Take 1 tablet by mouth 2 (two) times daily.  Dispense: 14 tablet; Refill: 0  Called patient back to discuss medication and UTI symptoms. Pt reports she didn't finish the treatment because it said "disgard after 8/16" Strongly encouraged patient to finish the entire prescription- and resent new Rx to pharmacy. Encouraged pt to call with questions or to ask the pharmacist if unsure about something printed on label.  [facility] FNP-C

## 2023-04-25 ENCOUNTER — Ambulatory Visit
Admission: RE | Admit: 2023-04-25 | Discharge: 2023-04-25 | Disposition: A | Discharge status: Home / Self Care | Payer: Medicaid Other | Source: Ambulatory Visit | Attending: Family Medicine | Admitting: Family Medicine

## 2023-04-25 DIAGNOSIS — Z3A18 18 weeks gestation of pregnancy: Secondary | ICD-10-CM | POA: Insufficient documentation

## 2023-04-25 DIAGNOSIS — O321XX0 Maternal care for breech presentation, not applicable or unspecified: Secondary | ICD-10-CM | POA: Diagnosis not present

## 2023-04-25 DIAGNOSIS — Z3480 Encounter for supervision of other normal pregnancy, unspecified trimester: Secondary | ICD-10-CM | POA: Diagnosis present

## 2023-05-02 ENCOUNTER — Ambulatory Visit: Payer: Medicaid Other | Admitting: Advanced Practice Midwife

## 2023-05-02 VITALS — BP 105/68 | HR 68 | Temp 98.4°F | Wt 216.4 lb

## 2023-05-02 DIAGNOSIS — Z3480 Encounter for supervision of other normal pregnancy, unspecified trimester: Secondary | ICD-10-CM

## 2023-05-02 DIAGNOSIS — Z3482 Encounter for supervision of other normal pregnancy, second trimester: Secondary | ICD-10-CM

## 2023-05-02 DIAGNOSIS — O2342 Unspecified infection of urinary tract in pregnancy, second trimester: Secondary | ICD-10-CM

## 2023-05-02 DIAGNOSIS — Z3A19 19 weeks gestation of pregnancy: Secondary | ICD-10-CM

## 2023-05-02 DIAGNOSIS — M545 Low back pain, unspecified: Secondary | ICD-10-CM

## 2023-05-02 MED ORDER — PRENATAL PLUS VITAMIN/MINERAL 27-1 MG PO TABS
1.0000 | ORAL_TABLET | Freq: Every day | ORAL | 3 refills | Status: DC
Start: 2023-05-02 — End: 2024-02-28

## 2023-05-02 NOTE — Progress Notes (Signed)
[name] Health Department Maternal Health Clinic  PRENATAL VISIT NOTE  Subjective:  Lauren Stephens is a 32 y.o. G3P2002 at [redacted]w[redacted]d being seen today for ongoing prenatal care.  She is currently monitored for the following issues for this low-risk pregnancy and has Insulin resistance; Morbid obesity (HCC) BMI=42.3; Nausea and vomiting; Supervision of other normal pregnancy, antepartum; Ovarian cyst during pregnancy; Maternal obesity without hypertension; Rubella non-immune status, antepartum; and UTI (urinary tract infection) during pregnancy Proteus mirabilis 02/07/23 on their problem list.  Patient reports backache.  Contractions: Not present. Vag. Bleeding: None.  Movement: Present. Pt c/o left lower back pain that does not radiate. She states that this pain makes it difficult to get out of bed in the morning and worsens towards the end of the day, making her limp slightly. She reports h/o a fall in her first pregnancy. She has gone to physical therapy in previous pregnancies which helped. Denies leaking of fluid/ROM.   The following portions of the patient's history were reviewed and updated as appropriate: allergies, current medications, past family history, past medical history, past social history, past surgical history and problem list. Problem list updated.  Objective:   Vitals:   05/02/23 1127  BP: 105/68  Pulse: 68  Temp: 98.4 F (36.9 C)  Weight: 216 lb 6.4 oz (98.2 kg)    Fetal Status: Fetal Heart Rate (bpm): 137 Fundal Height:  (approx. 1 cm below umbilicus) Movement: Present     General:  Alert, oriented and cooperative. Patient is in no acute distress.  Skin: Skin is warm and dry. No rash noted.   Cardiovascular: Normal heart rate noted  Respiratory: Normal respiratory effort, no problems with respiration noted  Abdomen: Soft, gravid, appropriate for gestational age.  Pain/Pressure: Absent     Pelvic: Cervical exam deferred        Extremities: Normal range  of motion.  Edema: None  Mental Status: Normal mood and affect. Normal behavior. Normal judgment and thought content.   Assessment and Plan:  Pregnancy: G3P2002 at [redacted]w[redacted]d  1. Supervision of other normal pregnancy, antepartum -13 lb 9.6 oz (-6.169 kg) -Gained 3lbs in 4 weeks -Pt had [place] on 9/3 w [facility] - no [place] report available at the moment. RN Lynnette contacted the clinic and they acknowledged they were behind and that the report would not be ready today. Discussed this with pt. Will review [place] at next appt. -Taking PNV and ASA daily - Prenatal Vit-Fe Fumarate-FA (PRENATAL PLUS VITAMIN/MINERAL) 27-1 MG TABS; Take 1 tablet by mouth daily.  Dispense: 90 tablet; Refill: 3  2. Urinary tract infection in mother during second trimester of pregnancy -Pt admits to missing some pills; has 2 pills left. Reports dysuria ended 3 days ago -Enc pt to take last 2 pills.  -Discussed UTI in pregnancy higher risk for nephritis -Negative CVAT - TOC Urine Culture today  3. [redacted] weeks gestation of pregnancy   4. Acute left-sided low back pain without sciatica -Recommended yoga/stretches, pregnancy support belt, lots of pillows supporting her during sleep -Negative CVAT - Ambulatory referral to Physical Therapy sent electronically today   Preterm labor symptoms and general obstetric precautions including but not limited to vaginal bleeding, contractions, leaking of fluid and fetal movement were reviewed in detail with the patient. Please refer to After Visit Summary for other counseling recommendations.  Return in about 4 weeks (around 05/30/2023) for Routine PNC.  No future appointments.  Alicia Amel, NP Arnetha Courser, CNM

## 2023-05-02 NOTE — Progress Notes (Signed)
Kept 04/25/23 [place] appt. Received flu vaccine mid 01/2023 and desires flu vaccine at next RV (noted on pink sticky). 2 remaining doses of antibiotic for UTI and per E. Sciora CNM, TOC today. Per client, no longer has UTI symptoms.Jossie Ng, RN

## 2023-05-03 ENCOUNTER — Other Ambulatory Visit: Payer: Self-pay | Admitting: Family Medicine

## 2023-05-03 ENCOUNTER — Telehealth: Payer: Self-pay | Admitting: Family Medicine

## 2023-05-03 DIAGNOSIS — O26899 Other specified pregnancy related conditions, unspecified trimester: Secondary | ICD-10-CM

## 2023-05-03 NOTE — Telephone Encounter (Signed)
Returned patient call. Patient states Physical Therapy appointment scheduled in [place] but this is too far for her to drive and would like to have an appointment in [place]. Forwarded message to care provider.  Also recommended patient call Physical Therapy office to check if there is they have an office closer to her home. BTHIELE RN

## 2023-05-03 NOTE — Telephone Encounter (Signed)
Patient Called wanting to talk to a nurse. Did not explain.

## 2023-05-04 ENCOUNTER — Ambulatory Visit (HOSPITAL_COMMUNITY): Payer: Medicaid Other | Admitting: Physical Therapy

## 2023-05-04 LAB — URINE CULTURE

## 2023-05-10 ENCOUNTER — Other Ambulatory Visit: Payer: Self-pay

## 2023-05-10 ENCOUNTER — Ambulatory Visit: Payer: Medicaid Other | Attending: Family Medicine

## 2023-05-10 DIAGNOSIS — M5459 Other low back pain: Secondary | ICD-10-CM | POA: Insufficient documentation

## 2023-05-10 DIAGNOSIS — M6281 Muscle weakness (generalized): Secondary | ICD-10-CM | POA: Insufficient documentation

## 2023-05-10 DIAGNOSIS — R293 Abnormal posture: Secondary | ICD-10-CM | POA: Insufficient documentation

## 2023-05-10 DIAGNOSIS — M545 Low back pain, unspecified: Secondary | ICD-10-CM | POA: Diagnosis not present

## 2023-05-10 DIAGNOSIS — R2689 Other abnormalities of gait and mobility: Secondary | ICD-10-CM | POA: Insufficient documentation

## 2023-05-10 DIAGNOSIS — O26899 Other specified pregnancy related conditions, unspecified trimester: Secondary | ICD-10-CM | POA: Insufficient documentation

## 2023-05-10 NOTE — Therapy (Signed)
OUTPATIENT PHYSICAL THERAPY FEMALE PELVIC EVALUATION   Patient Name: Lauren Stephens MRN: [id number] DOB:10-Jul-1991, 32 y.o., female Today's Date: 05/10/2023  END OF SESSION:  PT End of Session - 05/10/23 1119     Visit Number 1    Number of Visits 9    Date for PT Re-Evaluation 07/09/23    Authorization Type Medicaid    Progress Note Due on Visit 10    PT Start Time 1109    PT Stop Time 1155    PT Time Calculation (min) 46 min    Activity Tolerance Patient tolerated treatment well    Behavior During Therapy [facility] for tasks assessed/performed             Past Medical History:  Diagnosis Date   Bacterial vaginosis    10/2022  [facility]   Infertility, female    f-u [facility]   History reviewed. No pertinent surgical history. Patient Active Problem List   Diagnosis Date Noted   Rubella non-immune status, antepartum 02/14/2023   UTI (urinary tract infection) during pregnancy Proteus mirabilis 02/07/23 02/14/2023   Nausea and vomiting 02/07/2023   Supervision of other normal pregnancy, antepartum 02/07/2023   Ovarian cyst during pregnancy 02/07/2023   Maternal obesity without hypertension 02/07/2023   Morbid obesity (HCC) BMI=42.3 08/01/2019   Insulin resistance 02/05/2018    PCP: [facility]  REFERRING PROVIDER: Lenice Llamas, FNP  REFERRING DIAG: LBP with pregnancy  THERAPY DIAG:  Other low back pain - Plan: PT plan of care cert/re-cert  Muscle weakness (generalized) - Plan: PT plan of care cert/re-cert  Abnormal posture - Plan: PT plan of care cert/re-cert  Other abnormalities of gait and mobility - Plan: PT plan of care cert/re-cert  Rationale for Evaluation and Treatment: Rehabilitation  ONSET DATE: 05/03/23 referral date  SUBJECTIVE:                                                                                                                                                                                            SUBJECTIVE STATEMENT: URINARY FUNCTION: pt denied pain with urination and denied leakage. Pt voids every 30-60 minutes. Pt urinates approx. 4 times a night. She is [redacted] weeks pregnant.  BOWEL MOVEMENTS: twice a day. Pt denied pain with BMs. Pt had constipation during first trimester but has resolved. Pt does not take fiber or stool softner. SEXUAL FUNCTION: Pt reported pain is mild during intercourse (deep and initial) but does not stop intercourse. Pt does not use tampons due to preference, just pads. Period is irregular. Pt denied pain with OBGYN exams. CORE STABILITY: pt has LBP during  pregnancy, to the L side with pain down the LLE (when she steps down). It started with second pregnancy (she is pregnant - 20 weeks). She slipped on ice at nine months pregnant with first son (same week she gave birth). At worst: 10/10 and wants to cry, standing and getting out of bed is hard. At best: 3-4/10 with sitting and rest. On average and now: 3-4/10. Pt used to go to [employer] twice a week but cannot do it pregnant.  Fluid intake: Yes: pt drinks iced coffee in the morning. Then mainly water after that. Sometimes drinks juice with meal. She stopped drinking soda with last pregnancy but she drinks a little bit of sprite. No alcohol.     PAIN:  Are you having pain? Yes NPRS scale: 3-4/10 Pain location:  low back  Pain type: aching, stabbing Pain description: constant   Aggravating factors: standing, getting in/out bed, bending forward to pick something up Relieving factors: sitting, rest  PRECAUTIONS: Other: pregnant  RED FLAGS: None   WEIGHT BEARING RESTRICTIONS: No  FALLS:  Has patient fallen in last 6 months? No  LIVING ENVIRONMENT: Lives with: lives with their family, lives with their spouse, and lives with their son Lives in: House/apartment Stairs: Yes: External: 2 steps; none Has following equipment at home: None  OCCUPATION: SAHM  PLOF: Independent  PATIENT GOALS: I just want it to  go away.  PERTINENT HISTORY:  No PHM noted. Sexual abuse: not asked yet.  BOWEL MOVEMENT: Pain with bowel movement: No Type of bowel movement:Frequency twice daily Fully empty rectum: Yes:   Leakage: No Pads: No Fiber supplement: No  URINATION: Pain with urination: No Fully empty bladder: No Stream: Strong Urgency: No Frequency: every 30-60 minutes and nocturia x4. Leakage: none Pads: No  INTERCOURSE: Pain with intercourse: Initial Penetration and During Penetration Ability to have vaginal penetration:  Yes: but has pain Climax: did not ask. Marinoff Scale: 1/3  PREGNANCY: Two sons (4 and 60 y/o) Vaginal deliveries  Tearing **need to ask next session C-section deliveries  Currently pregnant Yes: 20 weeks  PROLAPSE: None   OBJECTIVE:    COGNITION: Overall cognitive status: Within functional limits for tasks assessed     SENSATION: Light touch: Appears intact Proprioception: Appears intact   GAIT: Distance walked: 75'x2 Assistive device utilized: None Level of assistance: Complete Independence Comments: wide BOS, decr. Stride length, decr. Trunk rotation and pain with walking. PT donned SIJ belt on pt and pt reported it felt good.   POSTURE: increased lumbar lordosis, decreased thoracic kyphosis, and anterior pelvic tilt  PELVIC ALIGNMENT: see above, incr. Postural sway with R and L SLS (>LLE SLS)  LUMBARAROM/PROM:  A/PROM A/PROM  eval  Flexion WNL  Extension Limited with concordant pain  Right lateral flexion WNL with concordant pain  Left lateral flexion WNL  Right rotation WNL  Left rotation WNL   (Blank rows = not tested)  LOWER EXTREMITY ROM: anything blank not tested 2/2 time constraints  Active ROM Right eval Left eval  Hip flexion    Hip extension    Hip abduction    Hip adduction    Hip internal rotation    Hip external rotation    Knee flexion    Knee extension    Ankle dorsiflexion    Ankle plantarflexion    Ankle  inversion    Ankle eversion     (Blank rows = not tested)  LOWER EXTREMITY MMT:  MMT Right eval Left eval  Hip flexion  Hip extension    Hip abduction    Hip adduction    Hip internal rotation    Hip external rotation    Knee flexion    Knee extension    Ankle dorsiflexion    Ankle plantarflexion    Ankle inversion    Ankle eversion     PALPATION: Not tested.  PELVIC MMT:   MMT eval  Vaginal   Internal Anal Sphincter   External Anal Sphincter   Puborectalis   Diastasis Recti   (Blank rows = not tested)        TONE:   PROLAPSE:   TODAY'S TREATMENT:                                                                                                                              DATE: 05/10/23  EVAL    PATIENT EDUCATION:  Education details: PT educated pt on exam findings, PT frequency, POC, and duration. PT educated pt on PFM function. PT educated pt on ligament/tendon changes during pregnancy and role of core stability to prevent back pain. PT educated pt on SIJ belt benefits and how to don, for support while standing/walking but not too tight. PT educated pt on toileting posture, water intake and proper shoewear. Person educated: Patient Education method: Explanation, Demonstration, Verbal cues, and Handouts Education comprehension: verbalized understanding and needs further education  HOME EXERCISE PROGRAM: See above.  ASSESSMENT:  CLINICAL IMPRESSION: Patient is a 32 y.o. female who was seen today for physical therapy evaluation and treatment for LBP during pregnancy. Pt's PMH is not significant per chart except for previous back pain with pregnancy. The following impairments were noted upon exam: gait deviations, impaired SLS balance (L>R), postural dysfunction (incr. Ant. Pelvic tilt and lx spine lordosis and decr. Tx spine kyphosis), weakness suspected based on subjective reports and gait-will assess next session, incr. Urinary frequency, back/hip/LLE pain.  The pt will benefit from skilled PT to improve deficits listed above to improve safety and QOL during all ADLs and caring for children.  OBJECTIVE IMPAIRMENTS: Abnormal gait, decreased balance, decreased coordination, decreased endurance, decreased mobility, decreased ROM, decreased strength, hypomobility, impaired flexibility, postural dysfunction, and pain.   ACTIVITY LIMITATIONS: carrying, lifting, bending, standing, squatting, sleeping, stairs, transfers, bed mobility, toileting, locomotion level, and caring for others  PARTICIPATION LIMITATIONS: meal prep, cleaning, laundry, interpersonal relationship, shopping, and community activity  PERSONAL FACTORS: Past/current experiences and 1 comorbidity: see above.  are also affecting patient's functional outcome.   REHAB POTENTIAL: Good  CLINICAL DECISION MAKING: Stable/uncomplicated  EVALUATION COMPLEXITY: Low   GOALS: Goals reviewed with patient? Yes  SHORT TERM GOALS: Target date: for all STGS: 06/07/23  Pt will be IND in HEP to improve pain, strength, coordination. Baseline: no HEP Goal status: INITIAL  2.  Finish exam and write goals as indicated. Baseline: limited 2/2 time constraints Goal status: INITIAL  3.  Pt will demo proper toileting posture to fully empty bladder and  reduce straining during bowel movement. Baseline: unable to demo Goal status: INITIAL  4.  Pt will complete FOTO and PT will write goal as indicated. Baseline: limited 2/2 time constraints Goal status: INITIAL   LONG TERM GOALS: Target date: for all LTGS: 07/05/23  Pt will demonstrate improved relaxation and contraction of pelvic floor muscles (PFM) with coordination of breath to decr. Pain with intercourse with spouse. Baseline: pain with intercourse Goal status: INITIAL  2.  Pt will experience improved bladder behaviors and ability to coordinate PFM contraction and relaxation with breath to report nocturia </=2/night. Baseline: 4x/night Goal  status: INITIAL  3.  Pt will improve hip, PFM and core strength to improve LBP/hip/LE pain to </=2/10 at worst to perform ADLs. Baseline: 10/10 at worst Goal status: INITIAL   PLAN: finish exam (palpate, DR, breath, MMT, HEP), review belt and toilet posture, bladder diary?  PT FREQUENCY: 1x/week  PT DURATION: 8 weeks  PLANNED INTERVENTIONS: Therapeutic exercises, Therapeutic activity, Neuromuscular re-education, Balance training, Gait training, Patient/Family education, Self Care, Joint mobilization, Joint manipulation, Stair training, Dry Needling, Spinal mobilization, Moist heat, Taping, Biofeedback, Manual therapy, and Re-evaluation     Shogo Larkey L, PT 05/10/2023, 12:14 PM  Zerita Boers, PT,DPT 05/10/23 12:14 PM Phone: [phone] Fax: [phone]

## 2023-05-10 NOTE — Patient Instructions (Signed)
TOILET POSTURE: Urination: feet flat, lean forward with forearms on legs to fully empty bladder. Bowel movement: place feet flat on Squatty Potty or stool so knees are higher than hips, lean forward to relax pelvic floor in order to avoid strain.  WATER: Start your day with one glass of water before anything.  SANDALS: Wear supportive shoes or sandals with heel straps. Crocs in sport mode.

## 2023-05-15 ENCOUNTER — Telehealth: Payer: Self-pay

## 2023-05-15 NOTE — Telephone Encounter (Signed)
Call transferred to clinic by Royden Purl, [facility] clerical intake clerk. Client verified next [facility] RV (05/23/23 - arrive 1030) and desired to know results of 04/25/23 [place]. Impression on [place] report read verbatim to client. Jossie Ng, RN

## 2023-05-23 ENCOUNTER — Ambulatory Visit: Payer: Medicaid Other | Admitting: Advanced Practice Midwife

## 2023-05-23 ENCOUNTER — Ambulatory Visit: Payer: Medicaid Other | Attending: Family Medicine

## 2023-05-23 ENCOUNTER — Other Ambulatory Visit: Payer: Self-pay

## 2023-05-23 VITALS — BP 109/66 | HR 71 | Temp 97.7°F | Wt 217.8 lb

## 2023-05-23 DIAGNOSIS — Z3480 Encounter for supervision of other normal pregnancy, unspecified trimester: Secondary | ICD-10-CM

## 2023-05-23 DIAGNOSIS — R293 Abnormal posture: Secondary | ICD-10-CM | POA: Insufficient documentation

## 2023-05-23 DIAGNOSIS — M5459 Other low back pain: Secondary | ICD-10-CM | POA: Insufficient documentation

## 2023-05-23 DIAGNOSIS — Z23 Encounter for immunization: Secondary | ICD-10-CM | POA: Diagnosis not present

## 2023-05-23 DIAGNOSIS — M6281 Muscle weakness (generalized): Secondary | ICD-10-CM | POA: Diagnosis present

## 2023-05-23 DIAGNOSIS — R2689 Other abnormalities of gait and mobility: Secondary | ICD-10-CM | POA: Diagnosis present

## 2023-05-23 DIAGNOSIS — O2342 Unspecified infection of urinary tract in pregnancy, second trimester: Secondary | ICD-10-CM

## 2023-05-23 DIAGNOSIS — M5430 Sciatica, unspecified side: Secondary | ICD-10-CM

## 2023-05-23 DIAGNOSIS — Z3482 Encounter for supervision of other normal pregnancy, second trimester: Secondary | ICD-10-CM

## 2023-05-23 NOTE — Progress Notes (Signed)
    [facility]  PRENATAL VISIT NOTE  Subjective:  Lauren Stephens Stefany Starace is a 32 y.o. G3P2002 at [redacted]w[redacted]d being seen today for ongoing prenatal care.  She is currently monitored for the following issues for this low-risk pregnancy and has Insulin resistance; Morbid obesity (HCC) BMI=42.3; Nausea and vomiting; Supervision of other normal pregnancy, antepartum; Ovarian cyst during pregnancy; Maternal obesity without hypertension; Rubella non-immune status, antepartum; and UTI (urinary tract infection) during pregnancy Proteus mirabilis 02/07/23 on their problem list.  Patient reports  left sciatica .  Contractions: Not present. Vag. Bleeding: None.  Movement: Present. Denies leaking of fluid/ROM.   The following portions of the patient's history were reviewed and updated as appropriate: allergies, current medications, past family history, past medical history, past social history, past surgical history and problem list. Problem list updated.  Objective:   Vitals:   05/23/23 1101  BP: 109/66  Pulse: 71  Temp: 97.7 F (36.5 C)  Weight: 217 lb 12.8 oz (98.8 kg)    Fetal Status: Fetal Heart Rate (bpm): 137 Fundal Height: 24 cm Movement: Present     General:  Alert, oriented and cooperative. Patient is in no acute distress.  Skin: Skin is warm and dry. No rash noted.   Cardiovascular: Normal heart rate noted  Respiratory: Normal respiratory effort, no problems with respiration noted  Abdomen: Soft, gravid, appropriate for gestational age.  Pain/Pressure: Absent     Pelvic: Cervical exam deferred        Extremities: Normal range of motion.  Edema: None  Mental Status: Normal mood and affect. Normal behavior. Normal judgment and thought content.   Assessment and Plan:  Pregnancy: G3P2002 at [redacted]w[redacted]d  1. Supervision of other normal pregnancy, antepartum Not working -12 lb 3.2 oz (-5.534 kg) 1 lb wt gain in last 3 wks Not exercising due to left  sciatica pain mostly at night; suggestions given Reviewed 04/25/23 u/s at 18 2/7 with AFI wnl, anatomy wnl, 3VC, fundal placenta   2. Sciatica, unspecified laterality Left mostly at night Had first PT apt 05/10/23 and has next one this afternoon  3. Morbid obesity (HCC) BMI=42.3 Taking ASA 81 mg daily  4. Urinary tract infection in mother during second trimester of pregnancy C&S 05/02/23=neg    Preterm labor symptoms and general obstetric precautions including but not limited to vaginal bleeding, contractions, leaking of fluid and fetal movement were reviewed in detail with the patient. Please refer to After Visit Summary for other counseling recommendations.  No follow-ups on file.  Future Appointments  Date Time Provider Department Center  05/23/2023  1:15 PM Zerita Boers L, PT [facility] None  05/30/2023 10:15 AM Sherren Kerns, PT [facility] None  06/06/2023 10:15 AM Sherren Kerns, PT [facility] None  06/13/2023 10:15 AM Sherren Kerns, PT [facility] None  06/20/2023 10:15 AM Sherren Kerns, PT [facility] None  06/20/2023  1:20 PM [facility] PROVIDER [facility] None  06/27/2023 10:15 AM Sherren Kerns, PT [facility] None  07/05/2023  2:00 PM Sherren Kerns, PT [facility] None  07/17/2023 10:15 AM Sherren Kerns, PT [facility] None  07/25/2023 10:15 AM Sherren Kerns, PT [facility] None  07/26/2023 11:45 AM Sherren Kerns, PT [facility] None  08/01/2023 11:00 AM Sherren Kerns, PT [facility] None  08/09/2023 11:45 AM Sherren Kerns, PT [facility] None    Alberteen Spindle, CNM

## 2023-05-23 NOTE — Progress Notes (Signed)
Here today for 22.0 week MH RV. Taking PNV and ASA every day. Denies ED/hospital visits since last RV. Has multiple PT appts scheduled. Offered and accepts Flu vaccine. Flu vaccine given and tolerated well. Tawny Hopping, RN
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
## 2023-05-30 ENCOUNTER — Other Ambulatory Visit: Payer: Self-pay

## 2023-05-30 ENCOUNTER — Ambulatory Visit: Payer: Medicaid Other

## 2023-05-30 DIAGNOSIS — M5459 Other low back pain: Secondary | ICD-10-CM

## 2023-05-30 DIAGNOSIS — M6281 Muscle weakness (generalized): Secondary | ICD-10-CM

## 2023-05-30 DIAGNOSIS — R293 Abnormal posture: Secondary | ICD-10-CM

## 2023-05-30 DIAGNOSIS — R2689 Other abnormalities of gait and mobility: Secondary | ICD-10-CM

## 2023-05-30 NOTE — Patient Instructions (Signed)
Thoracic standing extension: with hands on counter, knees slightly bent, flatten back and neck in neutral (hips at 90 degrees). Breathe into back 5 reps, you can also moves hands to one side and breathe into opposite side and repeat on other side.
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
## 2023-06-06 ENCOUNTER — Ambulatory Visit: Payer: Medicaid Other

## 2023-06-13 ENCOUNTER — Ambulatory Visit: Payer: Medicaid Other

## 2023-06-20 ENCOUNTER — Ambulatory Visit: Payer: Medicaid Other

## 2023-06-20 ENCOUNTER — Other Ambulatory Visit: Payer: Self-pay

## 2023-06-20 ENCOUNTER — Ambulatory Visit: Payer: Medicaid Other | Admitting: Advanced Practice Midwife

## 2023-06-20 VITALS — BP 111/71 | HR 89 | Temp 98.1°F | Wt 221.6 lb

## 2023-06-20 DIAGNOSIS — R293 Abnormal posture: Secondary | ICD-10-CM

## 2023-06-20 DIAGNOSIS — M5459 Other low back pain: Secondary | ICD-10-CM | POA: Diagnosis not present

## 2023-06-20 DIAGNOSIS — Z3482 Encounter for supervision of other normal pregnancy, second trimester: Secondary | ICD-10-CM

## 2023-06-20 DIAGNOSIS — M6281 Muscle weakness (generalized): Secondary | ICD-10-CM

## 2023-06-20 DIAGNOSIS — Z3480 Encounter for supervision of other normal pregnancy, unspecified trimester: Secondary | ICD-10-CM

## 2023-06-20 DIAGNOSIS — R2689 Other abnormalities of gait and mobility: Secondary | ICD-10-CM

## 2023-06-20 NOTE — Progress Notes (Signed)
Presents to clinic with mask on. Reports non-productive cough x3 weeks and occurs primarily in am and pm. Denies fever / malaise. Pregnancy Care Home forms completed today and in outguide for provider review. BTL consent signed and copy given to client, copy sent for scanning and original in BTL consent accordion folder in [facility] nurse workroom. Printout of all schedule PT appts given to client (has kept previously scheduled appt). Jossie Ng, RN

## 2023-06-20 NOTE — Therapy (Signed)
OUTPATIENT PHYSICAL THERAPY FEMALE PELVIC TREATMENT   Patient Name: Lauren Stephens MRN: [id number] DOB:May 15, 1991, 32 y.o., female Today's Date: 06/20/2023  END OF SESSION:  PT End of Session - 06/20/23 1014     Visit Number 4    Number of Visits 9    Date for PT Re-Evaluation 07/09/23    Authorization Type Medicaid    Progress Note Due on Visit 10    PT Start Time 1011    PT Stop Time 1051    PT Time Calculation (min) 40 min    Activity Tolerance Patient tolerated treatment well    Behavior During Therapy [facility] for tasks assessed/performed             Past Medical History:  Diagnosis Date   Bacterial vaginosis    10/2022  [facility]   Infertility, female    f-u [facility]   History reviewed. No pertinent surgical history. Patient Active Problem List   Diagnosis Date Noted   Sciatica 05/23/2023   Rubella non-immune status, antepartum 02/14/2023   UTI (urinary tract infection) during pregnancy Proteus mirabilis 02/07/23 02/14/2023   Nausea and vomiting 02/07/2023   Supervision of other normal pregnancy, antepartum 02/07/2023   Ovarian cyst during pregnancy 02/07/2023   Maternal obesity without hypertension 02/07/2023   Morbid obesity (HCC) BMI=42.3 08/01/2019   Insulin resistance 02/05/2018    PCP: [facility]  REFERRING PROVIDER: Lenice Llamas, FNP  REFERRING DIAG: LBP with pregnancy  THERAPY DIAG:  Other low back pain  Muscle weakness (generalized)  Abnormal posture  Other abnormalities of gait and mobility  Rationale for Evaluation and Treatment: Rehabilitation  ONSET DATE: 05/03/23 referral date  SUBJECTIVE:                                                                                                                                                                                           SUBJECTIVE STATEMENT: Pt reported she's been sick the last few weeks with a bad cough, pt has MD appt. At 1pm today. She gets  SOB 2/2 cough. Hasn't been performing HEP much 2/2 illness. Pt has been wearing SIJ belt. Pt has been able to have penetrative intercourse and climax, with minimal pain. Pt has experienced SUI with coughing and some urgency.    PAIN:  Are you having pain? Yes 06/20/23 NPRS scale: 3/10 Pain location: L hip pain   Pain type: aching, stabbing Pain description: constant   Aggravating factors: standing, getting in/out bed, bending forward to pick something up Relieving factors: sitting, rest  PRECAUTIONS: Other: pregnant  RED FLAGS: None   WEIGHT BEARING RESTRICTIONS: No  FALLS:  Has patient fallen in last 6 months? No  LIVING ENVIRONMENT: Lives with: lives with their family, lives with their spouse, and lives with their son Lives in: House/apartment Stairs: Yes: External: 2 steps; none Has following equipment at home: None  OCCUPATION: SAHM  PLOF: Independent  PATIENT GOALS: I just want it to go away.  PERTINENT HISTORY:  No PHM noted. Sexual abuse: not asked yet.  BOWEL MOVEMENT: Pain with bowel movement: No Type of bowel movement:Frequency twice daily Fully empty rectum: Yes:   Leakage: No Pads: No Fiber supplement: No  URINATION: Pain with urination: No Fully empty bladder: No Stream: Strong Urgency: No Frequency: every 30-60 minutes and nocturia x4. Leakage: none Pads: No  INTERCOURSE: Pain with intercourse: Initial Penetration and During Penetration Ability to have vaginal penetration:  Yes: but has pain Climax: did not ask. Marinoff Scale: 1/3  PREGNANCY: Two sons (4 and 3 y/o) Vaginal deliveries 2 Tearing: she had tearing with both deliveries.  C-section deliveries  Currently pregnant Yes: 20 weeks  PROLAPSE: None   OBJECTIVE:    COGNITION: Overall cognitive status: Within functional limits for tasks assessed     SENSATION: Light touch: Appears intact Proprioception: Appears intact   GAIT: Distance walked: 75'x2 Assistive  device utilized: None Level of assistance: Complete Independence Comments: wide BOS, decr. Stride length, decr. Trunk rotation and pain with walking. PT donned SIJ belt on pt and pt reported it felt good.   POSTURE: increased lumbar lordosis, decreased thoracic kyphosis, and anterior pelvic tilt  PELVIC ALIGNMENT: see above, incr. Postural sway with R and L SLS (>LLE SLS)  LUMBARAROM/PROM:  A/PROM A/PROM  eval  Flexion WNL  Extension Limited with concordant pain  Right lateral flexion WNL with concordant pain  Left lateral flexion WNL  Right rotation WNL  Left rotation WNL   (Blank rows = not tested)  LOWER EXTREMITY ROM: WFL except for B hip IR. Pt then reported pubic symphysis pain when getting in and out of car.  Active ROM Right eval Left eval  Hip flexion    Hip extension    Hip abduction    Hip adduction    Hip internal rotation    Hip external rotation    Knee flexion    Knee extension    Ankle dorsiflexion    Ankle plantarflexion    Ankle inversion    Ankle eversion     (Blank rows = not tested)  LOWER EXTREMITY MMT:  MMT Right eval Left eval  Hip flexion 4/5 4/5  Hip extension    Hip abduction 3+/5 3+/5  Hip adduction 3+/5 3+/5  Hip internal rotation    Hip external rotation    Knee flexion 4/5 4/5  Knee extension 5/5 5/5  Ankle dorsiflexion 5/5 5/5  Ankle plantarflexion    Ankle inversion    Ankle eversion     PALPATION: See below.  PELVIC MMT: pt able to contract with breath coordination with cues.    MMT eval  Vaginal   Internal Anal Sphincter   External Anal Sphincter   Puborectalis   Diastasis Recti   (Blank rows = not tested)        TONE:   PROLAPSE:   TODAY'S TREATMENT:  DATE: 06/20/23   NMR:  Access Code: [email or web address] URL: [email or web address] Date: 06/20/2023 Prepared by: Zerita Boers  Exercises - Seated Hip Internal Rotation AROM  - 1 x daily - 7 x weekly - 1 sets - 10 reps cues to slow down and squeeze pillow and take breaks prn. - Seated Transversus Abdominis Bracing  - 2 x daily - 7 x weekly - 1 sets - 10 reps cues for technique. Cues for deep core engagement. - Seated Thoracic Flexion and Rotation with Swiss Ball  - 1 x daily - 7 x weekly - 1 sets - 3 reps - 30-60 hold review - Seated March with Opposite Arm Flexion on Swiss Ball  - 1 x daily - 7 x weekly - 1 sets - 10 reps review - Pelvic Circles on Swiss Ball  - 1 x daily - 7 x weekly - 1 sets - 10 reps review - Seated Lateral/ant/post Pelvic Tilts on Swiss Ball  - 1 x daily - 7 x weekly - 1 sets - 10 reps review -standing child's pose at counter x30 sec. Hold - Seated Pelvic Floor Contraction on Swiss Ball (on mat table) - 1-2 x daily - 7 x weekly - 1 sets - 10 reps - Standing Hip Abduction with Counter Support  - 1 x daily - 3 x weekly - 2-3 sets - 10 reps Reviewed ball activities. Cues and demo for proper technique. S for safety. Pt required rest breaks 2/2 L hip pain but stated it was tolerable during HEP.   SELF CARE: PATIENT EDUCATION:  Education details:PT reviewed goals and HEP. PT discussed benefits of massage as pt asked about massage and chiropractor-PT explained why stretches and strengthening is so important as jt mobs/manipulations provide short term relief vs. Fixing the impairments.  Person educated: Patient Education method: Explanation, Demonstration, Verbal cues, and Handouts Education comprehension: verbalized understanding and needs further education  HOME EXERCISE PROGRAM: See above.  ASSESSMENT:  CLINICAL IMPRESSION: Today's skilled PT session focused on reviewing HEP and STGs. Pt demonstrated progress as she partially met or met goals. Pt not meeting all goals 2/2 to illness over the last 3 weeks and exercising difficult to illness and fatigue. The following impairments were  continued to be noted upon exam: gait deviations, impaired SLS balance (L>R), postural dysfunction (incr. Ant. Pelvic tilt and lx spine lordosis and decr. Tx spine kyphosis), incr. Urinary frequency, back/hip/LLE pain. The pt will benefit from skilled PT to improve deficits listed above to improve safety and QOL during all ADLs and caring for children.  OBJECTIVE IMPAIRMENTS: Abnormal gait, decreased balance, decreased coordination, decreased endurance, decreased mobility, decreased ROM, decreased strength, hypomobility, impaired flexibility, postural dysfunction, and pain.   ACTIVITY LIMITATIONS: carrying, lifting, bending, standing, squatting, sleeping, stairs, transfers, bed mobility, toileting, locomotion level, and caring for others  PARTICIPATION LIMITATIONS: meal prep, cleaning, laundry, interpersonal relationship, shopping, and community activity  PERSONAL FACTORS: Past/current experiences and 1 comorbidity: see above.  are also affecting patient's functional outcome.   REHAB POTENTIAL: Good  CLINICAL DECISION MAKING: Stable/uncomplicated  EVALUATION COMPLEXITY: Low   GOALS: Goals reviewed with patient? Yes  SHORT TERM GOALS: Target date: for all STGS: 06/07/23  Pt will be IND in HEP to improve pain, strength, coordination. Baseline: no HEP, 10/29: had to take a break 2/2 illness. Goal status: PARTIALLY MET  2.  Finish exam and write goals as indicated. Baseline: limited 2/2 time constraints Goal status: MET  3.  Pt will demo proper toileting posture to  fully empty bladder and reduce straining during bowel movement. Baseline: unable to demo, 10/29: feet are flat and pt leaning forward with hands on knees vs. Forearms 2/2 belly. Goal status: PARTIALLY MET   4.  Pt will complete FOTO and PT will write goal as indicated. Baseline: limited 2/2 time constraints Goal status: DEFERRED today.   LONG TERM GOALS: Target date: for all LTGS: 07/05/23  Pt will demonstrate improved  relaxation and contraction of pelvic floor muscles (PFM) with coordination of breath to decr. Pain with intercourse with spouse. Baseline: pain with intercourse Goal status: INITIAL  2.  Pt will experience improved bladder behaviors and ability to coordinate PFM contraction and relaxation with breath to report nocturia </=2/night. Baseline: 4x/night Goal status: INITIAL  3.  Pt will improve hip, PFM and core strength to improve LBP/hip/LE pain to </=2/10 at worst to perform ADLs. Baseline: 10/10 at worst Goal status: INITIAL   PLAN: progress strengthening and stretches, manual therapy prn  PT FREQUENCY: 1x/week  PT DURATION: 8 weeks  PLANNED INTERVENTIONS: Therapeutic exercises, Therapeutic activity, Neuromuscular re-education, Balance training, Gait training, Patient/Family education, Self Care, Joint mobilization, Joint manipulation, Stair training, Dry Needling, Spinal mobilization, Moist heat, Taping, Biofeedback, Manual therapy, and Re-evaluation     Ainhoa Rallo L, PT 06/20/2023, 10:14 AM  Zerita Boers, PT,DPT 06/20/23 10:14 AM Phone: [phone] Fax: [phone]

## 2023-06-20 NOTE — Progress Notes (Signed)
[facility]  PRENATAL VISIT NOTE  Subjective:  Lauren Stephens is a 32 y.o. G3P2002 at [redacted]w[redacted]d being seen today for ongoing prenatal care.  She is currently monitored for the following issues for this low-risk pregnancy and has Insulin resistance; Morbid obesity (HCC) BMI=42.3; Supervision of other normal pregnancy, antepartum; Ovarian cyst during pregnancy; Maternal obesity without hypertension; Rubella non-immune status, antepartum; UTI (urinary tract infection) during pregnancy Proteus mirabilis 02/07/23; and Sciatica on their problem list.  Patient reports no complaints.  Contractions: Not present. Vag. Bleeding: None.  Movement: Present. Denies leaking of fluid/ROM.   The following portions of the patient's history were reviewed and updated as appropriate: allergies, current medications, past family history, past medical history, past social history, past surgical history and problem list. Problem list updated.  Objective:   Vitals:   06/20/23 1318  BP: 111/71  Pulse: 89  Temp: 98.1 F (36.7 C)  Weight: 221 lb 9.6 oz (100.5 kg)    Fetal Status: Fetal Heart Rate (bpm): 134 Fundal Height: 29 cm Movement: Present     General:  Alert, oriented and cooperative. Patient is in no acute distress.  Skin: Skin is warm and dry. No rash noted.   Cardiovascular: Normal heart rate noted  Respiratory: Normal respiratory effort, no problems with respiration noted  Abdomen: Soft, gravid, appropriate for gestational age.  Pain/Pressure: Absent     Pelvic: Cervical exam deferred        Extremities: Normal range of motion.  Edema: None  Mental Status: Normal mood and affect. Normal behavior. Normal judgment and thought content.   Assessment and Plan:  Pregnancy: G3P2002 at [redacted]w[redacted]d  1. Supervision of other normal pregnancy, antepartum Not working Left sciatica resolving with PT (05/10/23, 05/23/23, 06/06/23, 06/27/23) C/o nonproductive cough x 3  wks with sore throat first week; suggestions given S>D but pt desires to wait 2 wks and if continues then agrees to u/s  2. Morbid obesity (HCC) BMI=42.3 Taking ASA 81 mg daily Walks 5x/wk x 3-4 min; counseled to walk x 20 min -8 lb 6.4 oz (-3.81 kg) 4 lb wt gain in last 4 wks   Preterm labor symptoms and general obstetric precautions including but not limited to vaginal bleeding, contractions, leaking of fluid and fetal movement were reviewed in detail with the patient. Please refer to After Visit Summary for other counseling recommendations.  Return in about 2 weeks (around 07/04/2023) for 28 week labs.  Future Appointments  Date Time Provider Department Center  06/27/2023 10:15 AM Zerita Boers L, PT [facility] None  07/05/2023  2:00 PM Sherren Kerns, PT [facility] None  07/17/2023 10:15 AM Sherren Kerns, PT [facility] None  07/25/2023 10:15 AM Sherren Kerns, PT [facility] None  07/26/2023 11:45 AM Sherren Kerns, PT [facility] None  08/01/2023 11:00 AM Sherren Kerns, PT [facility] None  08/09/2023 11:45 AM Sherren Kerns, PT [facility] None    Alberteen Spindle, CNM

## 2023-06-27 ENCOUNTER — Ambulatory Visit: Payer: Medicaid Other

## 2023-06-28 ENCOUNTER — Other Ambulatory Visit: Payer: Self-pay

## 2023-06-28 ENCOUNTER — Ambulatory Visit: Payer: Medicaid Other | Attending: Family Medicine

## 2023-06-28 DIAGNOSIS — R2689 Other abnormalities of gait and mobility: Secondary | ICD-10-CM | POA: Diagnosis present

## 2023-06-28 DIAGNOSIS — M5459 Other low back pain: Secondary | ICD-10-CM | POA: Insufficient documentation

## 2023-06-28 DIAGNOSIS — R293 Abnormal posture: Secondary | ICD-10-CM | POA: Insufficient documentation

## 2023-06-28 DIAGNOSIS — M6281 Muscle weakness (generalized): Secondary | ICD-10-CM | POA: Diagnosis present

## 2023-06-28 NOTE — Therapy (Addendum)
OUTPATIENT PHYSICAL THERAPY FEMALE PELVIC TREATMENT   Patient Name: Lauren Stephens MRN: [id number] DOB:1990-12-27, 32 y.o., female Today's Date: 06/28/2023  END OF SESSION:  PT End of Session - 06/28/23 1018     Visit Number 5    Number of Visits 9    Date for PT Re-Evaluation 07/09/23    Authorization Type Medicaid    Progress Note Due on Visit 10    PT Start Time 1016    PT Stop Time 1056    PT Time Calculation (min) 40 min    Activity Tolerance Patient tolerated treatment well    Behavior During Therapy [facility] for tasks assessed/performed             Past Medical History:  Diagnosis Date   Bacterial vaginosis    10/2022  [facility]   Infertility, female    f-u [facility]   History reviewed. No pertinent surgical history. Patient Active Problem List   Diagnosis Date Noted   Sciatica 05/23/2023   Rubella non-immune status, antepartum 02/14/2023   UTI (urinary tract infection) during pregnancy Proteus mirabilis 02/07/23 02/14/2023   Supervision of other normal pregnancy, antepartum 02/07/2023   Ovarian cyst during pregnancy 02/07/2023   Maternal obesity without hypertension 02/07/2023   Morbid obesity (HCC) BMI=42.3 08/01/2019   Insulin resistance 02/05/2018    PCP: [facility]  REFERRING PROVIDER: Lenice Llamas, FNP  REFERRING DIAG: LBP with pregnancy  THERAPY DIAG:  Other low back pain  Muscle weakness (generalized)  Abnormal posture  Other abnormalities of gait and mobility  Rationale for Evaluation and Treatment: Rehabilitation  ONSET DATE: 05/03/23 referral date  SUBJECTIVE:                                                                                                                                                                                           SUBJECTIVE STATEMENT: Pt reported she had some pain on the R side this last week. She stated MD told her she's measuring larger and will have testing for  gestational diabetes. Pt reported HEP is going well. Pt performs a little bit every day.     PAIN:  Are you having pain? No 06/28/23 NPRS scale: 0/10 Pain location: L hip pain  When pain is present: Pain type: aching, stabbing Pain description: constant   Aggravating factors: standing, getting in/out bed, bending forward to pick something up Relieving factors: sitting, rest  PRECAUTIONS: Other: pregnant  RED FLAGS: None   WEIGHT BEARING RESTRICTIONS: No  FALLS:  Has patient fallen in last 6 months? No  LIVING ENVIRONMENT: Lives with: lives with their family, lives with  their spouse, and lives with their son Lives in: House/apartment Stairs: Yes: External: 2 steps; none Has following equipment at home: None  OCCUPATION: SAHM  PLOF: Independent  PATIENT GOALS: I just want it to go away.  PERTINENT HISTORY:  No PHM noted. Sexual abuse: not asked yet.  BOWEL MOVEMENT: Pain with bowel movement: No Type of bowel movement:Frequency twice daily Fully empty rectum: Yes:   Leakage: No Pads: No Fiber supplement: No  URINATION: Pain with urination: No Fully empty bladder: No Stream: Strong Urgency: No Frequency: every 30-60 minutes and nocturia x4. Leakage: none Pads: No  INTERCOURSE: Pain with intercourse: Initial Penetration and During Penetration Ability to have vaginal penetration:  Yes: but has pain Climax: did not ask. Marinoff Scale: 1/3  PREGNANCY: Two sons (4 and 38 y/o) Vaginal deliveries 2 Tearing: she had tearing with both deliveries.  C-section deliveries  Currently pregnant Yes: 20 weeks  PROLAPSE: None   OBJECTIVE:    COGNITION: Overall cognitive status: Within functional limits for tasks assessed     SENSATION: Light touch: Appears intact Proprioception: Appears intact   GAIT: Distance walked: 75'x2 Assistive device utilized: None Level of assistance: Complete Independence Comments: wide BOS, decr. Stride length, decr. Trunk  rotation and pain with walking. PT donned SIJ belt on pt and pt reported it felt good.   POSTURE: increased lumbar lordosis, decreased thoracic kyphosis, and anterior pelvic tilt  PELVIC ALIGNMENT: see above, incr. Postural sway with R and L SLS (>LLE SLS)  LUMBARAROM/PROM:  A/PROM A/PROM  eval  Flexion WNL  Extension Limited with concordant pain  Right lateral flexion WNL with concordant pain  Left lateral flexion WNL  Right rotation WNL  Left rotation WNL   (Blank rows = not tested)  LOWER EXTREMITY ROM: WFL except for B hip IR. Pt then reported pubic symphysis pain when getting in and out of car.  Active ROM Right eval Left eval  Hip flexion    Hip extension    Hip abduction    Hip adduction    Hip internal rotation    Hip external rotation    Knee flexion    Knee extension    Ankle dorsiflexion    Ankle plantarflexion    Ankle inversion    Ankle eversion     (Blank rows = not tested)  LOWER EXTREMITY MMT:  MMT Right eval Left eval  Hip flexion 4/5 4/5  Hip extension    Hip abduction 3+/5 3+/5  Hip adduction 3+/5 3+/5  Hip internal rotation    Hip external rotation    Knee flexion 4/5 4/5  Knee extension 5/5 5/5  Ankle dorsiflexion 5/5 5/5  Ankle plantarflexion    Ankle inversion    Ankle eversion     PALPATION: See below.  PELVIC MMT: pt able to contract with breath coordination with cues.    MMT eval  Vaginal   Internal Anal Sphincter   External Anal Sphincter   Puborectalis   Diastasis Recti   (Blank rows = not tested)        TONE:   PROLAPSE:   TODAY'S TREATMENT:  DATE: 06/28/23   NMR:  Access Code: [email or web address] URL: [email or web address] Date: 06/28/2023 Prepared by: Zerita Boers  Exercises - Sidelying Hip Internal Rotation AROM  - 1 x daily - 7 x weekly - 1 sets - 10 reps after clams on L  side. - Seated Transversus Abdominis Bracing  - 2 x daily - 7 x weekly - 1 sets - 10 reps - Clamshell  - 1 x daily - 3 x weekly - 3 sets - 10 reps with and without red band. Red band added to HEP. - Swiss Ball March  - 1 x daily - 7 x weekly - 3 sets - 10 reps - Seated Piriformis Stretch  - 1 x daily - 7 x weekly - 1 sets - 3 reps - 30 hold  Pt reported 5/10 L hip pain after bear plank x 7 reps with 1-2 sec., so ceased and switched to swiss ball marches with TrA engaged. Cues and demo for proper technique. S for safety. Heat pack applied with pt in sidelying with pillow b/t knees with layers b/t pack and skin. Pt reported decr. Pain at end of session with heat pack.  Manual therapy: PT performed STM to L glute/hip rotators to decr. Pain and tension. Pt reported it felt "more loose" during amb. After STM. No incr. In pain.   SELF CARE: PATIENT EDUCATION:  Education details:PT reviewed new HEP. Provided pt with new L/XL belt and stated to wear when standing for walking long distances. Person educated: Patient Education method: Explanation, Demonstration, Verbal cues, and Handouts Education comprehension: verbalized understanding and needs further education  HOME EXERCISE PROGRAM: See above.  ASSESSMENT:  CLINICAL IMPRESSION: Today's skilled PT session focused on reviewing HEP and progressing HEP to more strength training and piriformis stretches to decr. Hip tension and pain. 2/10 L hip pain at end of session. The following impairments were continued to be noted upon exam: gait deviations, impaired SLS balance (L>R), postural dysfunction (incr. Ant. Pelvic tilt and lx spine lordosis and decr. Tx spine kyphosis), incr. Urinary frequency, back/hip/LLE pain. The pt will benefit from skilled PT to improve deficits listed above to improve safety and QOL during all ADLs and caring for children.  OBJECTIVE IMPAIRMENTS: Abnormal gait, decreased balance, decreased coordination, decreased endurance,  decreased mobility, decreased ROM, decreased strength, hypomobility, impaired flexibility, postural dysfunction, and pain.   ACTIVITY LIMITATIONS: carrying, lifting, bending, standing, squatting, sleeping, stairs, transfers, bed mobility, toileting, locomotion level, and caring for others  PARTICIPATION LIMITATIONS: meal prep, cleaning, laundry, interpersonal relationship, shopping, and community activity  PERSONAL FACTORS: Past/current experiences and 1 comorbidity: see above.  are also affecting patient's functional outcome.   REHAB POTENTIAL: Good  CLINICAL DECISION MAKING: Stable/uncomplicated  EVALUATION COMPLEXITY: Low   GOALS: Goals reviewed with patient? Yes  SHORT TERM GOALS: Target date: for all STGS: 06/07/23  Pt will be IND in HEP to improve pain, strength, coordination. Baseline: no HEP, 10/29: had to take a break 2/2 illness. Goal status: PARTIALLY MET  2.  Finish exam and write goals as indicated. Baseline: limited 2/2 time constraints Goal status: MET  3.  Pt will demo proper toileting posture to fully empty bladder and reduce straining during bowel movement. Baseline: unable to demo, 10/29: feet are flat and pt leaning forward with hands on knees vs. Forearms 2/2 belly. Goal status: PARTIALLY MET   4.  Pt will complete FOTO and PT will write goal as indicated. Baseline: limited 2/2 time constraints Goal status: DEFERRED today.   LONG  TERM GOALS: Target date: for all LTGS: 07/05/23  Pt will demonstrate improved relaxation and contraction of pelvic floor muscles (PFM) with coordination of breath to decr. Pain with intercourse with spouse. Baseline: pain with intercourse Goal status: INITIAL  2.  Pt will experience improved bladder behaviors and ability to coordinate PFM contraction and relaxation with breath to report nocturia </=2/night. Baseline: 4x/night Goal status: INITIAL  3.  Pt will improve hip, PFM and core strength to improve LBP/hip/LE pain to  </=2/10 at worst to perform ADLs. Baseline: 10/10 at worst Goal status: INITIAL   PLAN: same progress strengthening and stretches, manual therapy prn  PT FREQUENCY: 1x/week  PT DURATION: 8 weeks  PLANNED INTERVENTIONS: Therapeutic exercises, Therapeutic activity, Neuromuscular re-education, Balance training, Gait training, Patient/Family education, Self Care, Joint mobilization, Joint manipulation, Stair training, Dry Needling, Spinal mobilization, Moist heat, Taping, Biofeedback, Manual therapy, and Re-evaluation     Kolt Mcwhirter L, PT 06/28/2023, 10:19 AM  Zerita Boers, PT,DPT 06/28/23 10:19 AM Phone: [phone] Fax: [phone]

## 2023-07-04 ENCOUNTER — Ambulatory Visit: Payer: Medicaid Other | Admitting: Advanced Practice Midwife

## 2023-07-04 VITALS — BP 106/69 | HR 70 | Temp 97.0°F | Wt 219.2 lb

## 2023-07-04 DIAGNOSIS — Z23 Encounter for immunization: Secondary | ICD-10-CM | POA: Diagnosis not present

## 2023-07-04 DIAGNOSIS — Z3480 Encounter for supervision of other normal pregnancy, unspecified trimester: Secondary | ICD-10-CM

## 2023-07-04 DIAGNOSIS — M5430 Sciatica, unspecified side: Secondary | ICD-10-CM

## 2023-07-04 DIAGNOSIS — Z3483 Encounter for supervision of other normal pregnancy, third trimester: Secondary | ICD-10-CM

## 2023-07-04 NOTE — Progress Notes (Addendum)
[facility]  PRENATAL VISIT NOTE  Subjective:  Lauren Stephens is a 32 y.o. G3P2002 at [redacted]w[redacted]d being seen today for ongoing prenatal care.  She is currently monitored for the following issues for this high-risk pregnancy and has Insulin resistance; Morbid obesity (HCC) BMI=42.3; Supervision of other normal pregnancy, antepartum; Ovarian cyst during pregnancy; Maternal obesity without hypertension; Rubella non-immune status, antepartum; UTI (urinary tract infection) during pregnancy Proteus mirabilis 02/07/23; and Sciatica on their problem list.  Patient reports no complaints.  Contractions: Not present. Vag. Bleeding: None.  Movement: Present. Denies leaking of fluid/ROM.   The following portions of the patient's history were reviewed and updated as appropriate: allergies, current medications, past family history, past medical history, past social history, past surgical history and problem list. Problem list updated.  Objective:   Vitals:   07/04/23 1557  BP: 106/69  Pulse: 70  Temp: (!) 97 F (36.1 C)  Weight: 219 lb 3.2 oz (99.4 kg)    Fetal Status: Fetal Heart Rate (bpm): 130 Fundal Height: 33 cm Movement: Present     General:  Alert, oriented and cooperative. Patient is in no acute distress.  Skin: Skin is warm and dry. No rash noted.   Cardiovascular: Normal heart rate noted  Respiratory: Normal respiratory effort, no problems with respiration noted  Abdomen: Soft, gravid, appropriate for gestational age.  Pain/Pressure: Absent     Pelvic: Cervical exam deferred        Extremities: Normal range of motion.  Edema: None  Mental Status: Normal mood and affect. Normal behavior. Normal judgment and thought content.   Assessment and Plan:  Pregnancy: G3P2002 at [redacted]w[redacted]d  1. Supervision of other normal pregnancy, antepartum MFM u/s ordered for continued S>D Needs anesthesia consult at 32 wks--RN to call and schedule Not  working Here with 2 sons 1 hour glucola today Cough resolved Walks 5x/wk x 3-4 min  - Glucose, 1 hour - HIV-1/HIV-2 Qualitative RNA - RPR - Hemoglobin, fingerstick - [place] MFM OB DETAIL +14 WK; Future  2. Morbid obesity (HCC) BMI=42.3 Taking ASA 81 MG daily -10 lb 12.8 oz (-4.899 kg) 2 lb wt loss in last 2 wks Needs anesthesia consult 32 wks--RN to call and schedule  3. Sciatica, unspecified laterality Has been keeping PT apts 06/27/23 and 07/05/23   Preterm labor symptoms and general obstetric precautions including but not limited to vaginal bleeding, contractions, leaking of fluid and fetal movement were reviewed in detail with the patient. Please refer to After Visit Summary for other counseling recommendations.  Return in about 2 weeks (around 07/18/2023) for routine PNC.  Future Appointments  Date Time Provider Department Center  07/05/2023  2:00 PM Sherren Kerns, PT [facility] None  07/17/2023 10:15 AM Sherren Kerns, PT [facility] None  07/25/2023 10:15 AM Sherren Kerns, PT [facility] None  07/26/2023 11:45 AM Sherren Kerns, PT [facility] None  08/01/2023 11:00 AM Sherren Kerns, PT [facility] None  08/09/2023 11:45 AM Sherren Kerns, PT [facility] None    Alberteen Spindle, CNM

## 2023-07-04 NOTE — Progress Notes (Signed)
Tdap given, tolerated well, VIS given and NCIR given.Burt Knack, RN

## 2023-07-04 NOTE — Progress Notes (Signed)
Patient here for MH RV at 28 weeks. Needs 28 week labs and Tdap today.Burt Knack, RN

## 2023-07-04 NOTE — Progress Notes (Signed)
Hgb 13.1 today.Burt Knack, RN

## 2023-07-05 ENCOUNTER — Ambulatory Visit: Payer: Medicaid Other

## 2023-07-05 LAB — HEMOGLOBIN, FINGERSTICK: Hemoglobin: 13.1 g/dL (ref 11.1–15.9)

## 2023-07-06 LAB — HIV-1/HIV-2 QUALITATIVE RNA
HIV-1 RNA, Qualitative: NONREACTIVE
HIV-2 RNA, Qualitative: NONREACTIVE

## 2023-07-06 LAB — GLUCOSE, 1 HOUR GESTATIONAL: Gestational Diabetes Screen: 133 mg/dL (ref 70–139)

## 2023-07-06 LAB — RPR: RPR Ser Ql: NONREACTIVE

## 2023-07-07 ENCOUNTER — Telehealth: Payer: Self-pay

## 2023-07-07 NOTE — Telephone Encounter (Signed)
TC to patient to inform her of 08/02/23 [facility] U/S at 1:00pm. Patient counseled to arrive at 12:45. [place] location. Patient states understanding.Burt Knack, RN

## 2023-07-07 NOTE — Telephone Encounter (Signed)
[facility] returned call about patient anesthesia consult. Per Florentina Addison at [facility] they do not schedule anesthesia consults until BMI is 45 or greater. Patient BMI is currently at 41. 42. Marland KitchenBurt Knack, RN

## 2023-07-17 ENCOUNTER — Other Ambulatory Visit: Payer: Self-pay

## 2023-07-17 ENCOUNTER — Ambulatory Visit: Payer: Medicaid Other

## 2023-07-17 ENCOUNTER — Telehealth: Payer: Self-pay

## 2023-07-17 DIAGNOSIS — R2689 Other abnormalities of gait and mobility: Secondary | ICD-10-CM

## 2023-07-17 DIAGNOSIS — M5459 Other low back pain: Secondary | ICD-10-CM | POA: Diagnosis not present

## 2023-07-17 DIAGNOSIS — R293 Abnormal posture: Secondary | ICD-10-CM

## 2023-07-17 DIAGNOSIS — M6281 Muscle weakness (generalized): Secondary | ICD-10-CM

## 2023-07-17 NOTE — Telephone Encounter (Signed)
TC to patient to remind her of appointment tomorrow, time to arrive 9:00 am. Patient is overbooked with new OB, so patient offered open appointment at 8:20, to arrive 8:00 am, but patient cannot come that early as she has children that need to go to school. Patient appointment has been changed to the 8:20 time slot to limit overbooking tomorrow, but patient will arrive at 9:00. Burt Knack, RN

## 2023-07-17 NOTE — Therapy (Signed)
OUTPATIENT PHYSICAL THERAPY FEMALE PELVIC TREATMENT   Patient Name: Lauren Stephens MRN: [id number] DOB:Aug 21, 1991, 32 y.o., female Today's Date: 07/17/2023  END OF SESSION:  PT End of Session - 07/17/23 1022     Visit Number 6    Number of Visits 9    Date for PT Re-Evaluation 07/09/23    Authorization Type Medicaid    Progress Note Due on Visit 10    PT Start Time 1019    PT Stop Time 1100    PT Time Calculation (min) 41 min    Activity Tolerance Patient tolerated treatment well    Behavior During Therapy [facility] for tasks assessed/performed             Past Medical History:  Diagnosis Date   Bacterial vaginosis    10/2022  [facility]   Infertility, female    f-u [facility]   History reviewed. No pertinent surgical history. Patient Active Problem List   Diagnosis Date Noted   Sciatica 05/23/2023   Rubella non-immune status, antepartum 02/14/2023   UTI (urinary tract infection) during pregnancy Proteus mirabilis 02/07/23 02/14/2023   Supervision of other normal pregnancy, antepartum 02/07/2023   Ovarian cyst during pregnancy 02/07/2023   Maternal obesity without hypertension 02/07/2023   Morbid obesity (HCC) BMI=42.3 08/01/2019   Insulin resistance 02/05/2018    PCP: [facility]  REFERRING PROVIDER: Lenice Llamas, FNP  REFERRING DIAG: LBP with pregnancy  THERAPY DIAG:  Other low back pain  Muscle weakness (generalized)  Abnormal posture  Other abnormalities of gait and mobility  Rationale for Evaluation and Treatment: Rehabilitation  ONSET DATE: 05/03/23 referral date  SUBJECTIVE:                                                                                                                                                                                           SUBJECTIVE STATEMENT: Pt reported she has been having issues sleeping 2/2 pain at pubic symphysis (PS) and hips. Pt has upper back pain. Pt missed last appt. 2/2  son was sick. PS pain incr. With walking.   PAIN:  Are you having pain? No 07/17/23 NPRS scale: 4/10 at rest with heating pack on tx spine, walking pain incr. To 4/10 but feels it more than when at rest. Pain location: back, PS, and B hip pain  When pain is present: Pain type: aching, stabbing Pain description: constant   Aggravating factors: standing, getting in/out bed, bending forward to pick something up Relieving factors: sitting, rest  PRECAUTIONS: Other: pregnant  RED FLAGS: None   WEIGHT BEARING RESTRICTIONS: No  FALLS:  Has patient fallen in  last 6 months? No  LIVING ENVIRONMENT: Lives with: lives with their family, lives with their spouse, and lives with their son Lives in: House/apartment Stairs: Yes: External: 2 steps; none Has following equipment at home: None  OCCUPATION: SAHM  PLOF: Independent  PATIENT GOALS: I just want it to go away.  PERTINENT HISTORY:  No PHM noted. Sexual abuse: not asked yet.  BOWEL MOVEMENT: Pain with bowel movement: No Type of bowel movement:Frequency twice daily Fully empty rectum: Yes:   Leakage: No Pads: No Fiber supplement: No  URINATION: Pain with urination: No Fully empty bladder: No Stream: Strong Urgency: No Frequency: every 30-60 minutes and nocturia x4. Leakage: none Pads: No  INTERCOURSE: Pain with intercourse: Initial Penetration and During Penetration Ability to have vaginal penetration:  Yes: but has pain Climax: did not ask. Marinoff Scale: 1/3  PREGNANCY: Two sons (4 and 35 y/o) Vaginal deliveries 2 Tearing: she had tearing with both deliveries.  C-section deliveries  Currently pregnant Yes: 20 weeks  PROLAPSE: None   OBJECTIVE:    COGNITION: Overall cognitive status: Within functional limits for tasks assessed     SENSATION: Light touch: Appears intact Proprioception: Appears intact   GAIT: Distance walked: 75'x2 Assistive device utilized: None Level of assistance: Complete  Independence Comments: wide BOS, decr. Stride length, decr. Trunk rotation and pain with walking. PT donned SIJ belt on pt and pt reported it felt good.   POSTURE: increased lumbar lordosis, decreased thoracic kyphosis, and anterior pelvic tilt  PELVIC ALIGNMENT: see above, incr. Postural sway with R and L SLS (>LLE SLS)  LUMBARAROM/PROM:  A/PROM A/PROM  eval  Flexion WNL  Extension Limited with concordant pain  Right lateral flexion WNL with concordant pain  Left lateral flexion WNL  Right rotation WNL  Left rotation WNL   (Blank rows = not tested)  LOWER EXTREMITY ROM: WFL except for B hip IR. Pt then reported pubic symphysis pain when getting in and out of car.  Active ROM Right eval Left eval  Hip flexion    Hip extension    Hip abduction    Hip adduction    Hip internal rotation    Hip external rotation    Knee flexion    Knee extension    Ankle dorsiflexion    Ankle plantarflexion    Ankle inversion    Ankle eversion     (Blank rows = not tested)  LOWER EXTREMITY MMT:  MMT Right eval Left eval  Hip flexion 4/5 4/5  Hip extension    Hip abduction 3+/5 3+/5  Hip adduction 3+/5 3+/5  Hip internal rotation    Hip external rotation    Knee flexion 4/5 4/5  Knee extension 5/5 5/5  Ankle dorsiflexion 5/5 5/5  Ankle plantarflexion    Ankle inversion    Ankle eversion     PALPATION: See below.  PELVIC MMT: pt able to contract with breath coordination with cues.    MMT eval  Vaginal   Internal Anal Sphincter   External Anal Sphincter   Puborectalis   Diastasis Recti   (Blank rows = not tested)        TONE:   PROLAPSE:   TODAY'S TREATMENT:  DATE: 07/17/23   NMR:  Access Code: [email or web address] URL: [email or web address] Date: 07/17/2023 Prepared by: Zerita Boers  Exercises - Seated Hip add. Squeezes with  pillow AROM  - 1 x daily - 7 x weekly - 1 sets - 10 reps. Cues to contract at 10-50% based on PS pain.  - Seated Transversus Abdominis Bracing  - 2 x daily - 7 x weekly - 1 sets - 10 reps - Seated Thoracic Flexion and Rotation with Swiss Ball  - 1 x daily - 7 x weekly - 1 sets - 3 reps - 30-60 hold - Seated March with Opposite Arm Flexion on Swiss Ball  - 1 x daily - 7 x weekly - 1 sets - 10 reps - Pelvic Circles on Swiss Ball  - 1 x daily - 7 x weekly - 1 sets - 10 reps - Seated Lateral Pelvic Tilt on Swiss Ball  - 1 x daily - 7 x weekly - 1 sets - 10 reps - Seated Pelvic Floor Contraction on Swiss Ball  - 1-2 x daily - 7 x weekly - 1 sets - 10 reps - Swiss Ball March  - 1 x daily - 7 x weekly - 3 sets - 10 reps  TA: pt performed seated<>sidelying and rolling side to side on bed to with LEs together to decr. PS pain at night and practice getting "in and out of car" with small steps to decr. Pain. Cues and demo for proper technique. S for safety.  Patient ed: Cues and demo for proper technique. S for safety. Heat pack applied with pt in sidelying with pillow b/t knees with layers b/t pack and skin. Pt reported decr. Pain at end of session with heat pack. PT discussed modifying exercises to no hip abd. To decr. PS pain. PT reviewed all HEP and why each one is important  Manual therapy: PT palpated pubic symphysis (PS) and it was TTP but no deviations noted.    SELF CARE: PATIENT EDUCATION:  Education details:PT reviewed HEP and the exercises taken out 2/2 PS pain. PT educated pt pubic symphysis pain, walking in small bouts (5 minutes), and performing HEP to decr. Pain. PT educated pt on potential dry needling of hips/LEs to decr. Pain, educated on possible side effects and benefits. PT agreeable and would like PT to ask OBGYN. Person educated: Patient Education method: Explanation, Demonstration, Verbal cues, and Handouts Education comprehension: verbalized understanding and needs further  education  HOME EXERCISE PROGRAM: See above.  ASSESSMENT:  CLINICAL IMPRESSION: Today's skilled PT session focused on reviewing HEP and modifying HEP to decr. Stress on pubic symphysis joint while improving core and back strength. Pt may benefit from DN next session as indicated to reduce trigger points and pain in order to walk without pain and continue strength training. The following impairments were continued to be noted upon exam: gait deviations, impaired SLS balance (L>R), postural dysfunction (incr. Ant. Pelvic tilt and lx spine lordosis and decr. Tx spine kyphosis), incr. Urinary frequency, back/hip/LLE pain. The pt will benefit from skilled PT to improve deficits listed above to improve safety and QOL during all ADLs and caring for children. Pt missed appt's, so all LTGs are on-going, PT requesting add'l 4 visits (one each week for 4 weeks) to work towards goals.  OBJECTIVE IMPAIRMENTS: Abnormal gait, decreased balance, decreased coordination, decreased endurance, decreased mobility, decreased ROM, decreased strength, hypomobility, impaired flexibility, postural dysfunction, and pain.   ACTIVITY LIMITATIONS: carrying, lifting, bending, standing, squatting, sleeping, stairs, transfers, bed  mobility, toileting, locomotion level, and caring for others  PARTICIPATION LIMITATIONS: meal prep, cleaning, laundry, interpersonal relationship, shopping, and community activity  PERSONAL FACTORS: Past/current experiences and 1 comorbidity: see above.  are also affecting patient's functional outcome.   REHAB POTENTIAL: Good  CLINICAL DECISION MAKING: Stable/uncomplicated  EVALUATION COMPLEXITY: Low   GOALS: Goals reviewed with patient? Yes  SHORT TERM GOALS: Target date: for all STGS: 06/07/23  Pt will be IND in HEP to improve pain, strength, coordination. Baseline: no HEP, 10/29: had to take a break 2/2 illness. Goal status: PARTIALLY MET  2.  Finish exam and write goals as  indicated. Baseline: limited 2/2 time constraints Goal status: MET  3.  Pt will demo proper toileting posture to fully empty bladder and reduce straining during bowel movement. Baseline: unable to demo, 10/29: feet are flat and pt leaning forward with hands on knees vs. Forearms 2/2 belly. Goal status: PARTIALLY MET   4.  Pt will complete FOTO and PT will write goal as indicated. Baseline: limited 2/2 time constraints Goal status: DEFERRED today.   LONG TERM GOALS: Target date: for all LTGS: 07/05/23  Pt will demonstrate improved relaxation and contraction of pelvic floor muscles (PFM) with coordination of breath to decr. Pain with intercourse with spouse. Baseline: pain with intercourse Goal status: on-going  2.  Pt will experience improved bladder behaviors and ability to coordinate PFM contraction and relaxation with breath to report nocturia </=2/night. Baseline: 4x/night, 2x/night on 07/17/23 Goal status: on-going  3.  Pt will improve hip, PFM and core strength to improve LBP/hip/LE pain to </=2/10 at worst to perform ADLs. Baseline: 10/10 at worst Goal status: on-going   PLAN: same progress strengthening and stretches, manual therapy prn  PT FREQUENCY: 1x/week  PT DURATION: 8 weeks  PLANNED INTERVENTIONS: Therapeutic exercises, Therapeutic activity, Neuromuscular re-education, Balance training, Gait training, Patient/Family education, Self Care, Joint mobilization, Joint manipulation, Stair training, Dry Needling, Spinal mobilization, Moist heat, Taping, Biofeedback, Manual therapy, and Re-evaluation     Lauren Stephens L, PT 07/17/2023, 11:22 AM  Zerita Boers, PT,DPT 07/17/23 11:22 AM Phone: [phone] Fax: [phone]

## 2023-07-18 ENCOUNTER — Ambulatory Visit: Payer: Medicaid Other | Admitting: Advanced Practice Midwife

## 2023-07-18 VITALS — BP 112/71 | HR 88 | Temp 98.0°F | Wt 221.8 lb

## 2023-07-18 DIAGNOSIS — Z3483 Encounter for supervision of other normal pregnancy, third trimester: Secondary | ICD-10-CM

## 2023-07-18 DIAGNOSIS — Z3480 Encounter for supervision of other normal pregnancy, unspecified trimester: Secondary | ICD-10-CM

## 2023-07-18 DIAGNOSIS — M5430 Sciatica, unspecified side: Secondary | ICD-10-CM

## 2023-07-18 NOTE — Progress Notes (Addendum)
[facility]  PRENATAL VISIT NOTE  Subjective:  Lauren Stephens is a 32 y.o. G3P2002 at [redacted]w[redacted]d being seen today for ongoing prenatal care.  She is currently monitored for the following issues for this low-risk pregnancy and has Insulin resistance; Morbid obesity (HCC) BMI=42.3; Supervision of other normal pregnancy, antepartum; Ovarian cyst during pregnancy; Maternal obesity without hypertension; Rubella non-immune status, antepartum; UTI (urinary tract infection) during pregnancy Proteus mirabilis 02/07/23; and Sciatica on their problem list.  Patient reports backache.  Contractions: Not present. Vag. Bleeding: None.  Movement: Present. Denies leaking of fluid/ROM.   The following portions of the patient's history were reviewed and updated as appropriate: allergies, current medications, past family history, past medical history, past social history, past surgical history and problem list. Problem list updated.  Objective:   Vitals:   07/18/23 0907  BP: 112/71  Pulse: 88  Temp: 98 F (36.7 C)  Weight: 221 lb 12.8 oz (100.6 kg)    Fetal Status: Fetal Heart Rate (bpm): 135 Fundal Height: 35 cm Movement: Present     General:  Alert, oriented and cooperative. Patient is in no acute distress.  Skin: Skin is warm and dry. No rash noted.   Cardiovascular: Normal heart rate noted  Respiratory: Normal respiratory effort, no problems with respiration noted  Abdomen: Soft, gravid, appropriate for gestational age.  Pain/Pressure: Absent     Pelvic: Cervical exam deferred        Extremities: Normal range of motion.  Edema: None  Mental Status: Normal mood and affect. Normal behavior. Normal judgment and thought content.   Assessment and Plan:  Pregnancy: G3P2002 at [redacted]w[redacted]d  1. Supervision of other normal pregnancy, antepartum Walking 2-3x/wk x 10 min; encouraged swimming in light of sciatica PT apts for sciatica 06/27/23, 07/05/23, 07/17/23,  07/24/23 -8 lb 3.2 oz (-3.719 kg) 2 lb wt gain in last 2 wks 1 hour glucola=133 on 07/04/23 Not working C/o miseries of pregnancy; suggestions given Wants BTL C/o GERD--suggestions given Has MFM u/s 08/02/23 for S>D   2. Morbid obesity (HCC) BMI=42.3 Taking ASA 81 mg daily Anesthesia consult not needed due to BMI<45  3. Sciatica, unspecified laterality See above note   Preterm labor symptoms and general obstetric precautions including but not limited to vaginal bleeding, contractions, leaking of fluid and fetal movement were reviewed in detail with the patient. Please refer to After Visit Summary for other counseling recommendations.  Return in about 2 weeks (around 08/01/2023) for routine PNC.  Future Appointments  Date Time Provider Department Center  07/25/2023 10:15 AM Sherren Kerns, PT [facility] None  07/26/2023 11:45 AM Sherren Kerns, PT [facility] None  08/01/2023 11:00 AM Sherren Kerns, PT [facility] None  08/02/2023  1:00 PM [facility] US1 [facility]  08/09/2023 11:45 AM Sherren Kerns, PT [facility] None    Alberteen Spindle, CNM

## 2023-07-18 NOTE — Progress Notes (Addendum)
Patient here for MH RV at 30 weeks. Aware of PT appointments and [facility] [place] on 08/02/23 at 1:00. C/O pain in upper abdomen, having trouble sleeping, and eating. Also pain in pubic area. Marland KitchenMarland KitchenMarland KitchenBurt Knack, RN

## 2023-07-25 ENCOUNTER — Ambulatory Visit: Payer: Medicaid Other

## 2023-07-26 ENCOUNTER — Other Ambulatory Visit: Payer: Self-pay

## 2023-07-26 ENCOUNTER — Ambulatory Visit: Payer: Medicaid Other | Attending: Family Medicine

## 2023-07-26 DIAGNOSIS — M6281 Muscle weakness (generalized): Secondary | ICD-10-CM | POA: Diagnosis present

## 2023-07-26 DIAGNOSIS — R293 Abnormal posture: Secondary | ICD-10-CM | POA: Diagnosis present

## 2023-07-26 DIAGNOSIS — M5459 Other low back pain: Secondary | ICD-10-CM | POA: Diagnosis present

## 2023-07-26 DIAGNOSIS — R2689 Other abnormalities of gait and mobility: Secondary | ICD-10-CM | POA: Diagnosis present

## 2023-07-26 NOTE — Therapy (Signed)
OUTPATIENT PHYSICAL THERAPY FEMALE PELVIC TREATMENT   Patient Name: Lauren Stephens MRN: [id number] DOB:04-Apr-1991, 32 y.o., female Today's Date: 07/26/2023  END OF SESSION:  PT End of Session - 07/26/23 1026     Visit Number 7    Number of Visits 9    Date for PT Re-Evaluation 07/09/23    Authorization Type Medicaid    Progress Note Due on Visit 10    PT Start Time 1023    PT Stop Time 1101    PT Time Calculation (min) 38 min    Activity Tolerance Patient tolerated treatment well    Behavior During Therapy [facility] for tasks assessed/performed             Past Medical History:  Diagnosis Date   Bacterial vaginosis    10/2022  [facility]   Infertility, female    f-u [facility]   History reviewed. No pertinent surgical history. Patient Active Problem List   Diagnosis Date Noted   Sciatica 05/23/2023   Rubella non-immune status, antepartum 02/14/2023   UTI (urinary tract infection) during pregnancy Proteus mirabilis 02/07/23 02/14/2023   Supervision of other normal pregnancy, antepartum 02/07/2023   Ovarian cyst during pregnancy 02/07/2023   Maternal obesity without hypertension 02/07/2023   Morbid obesity (HCC) BMI=42.3 08/01/2019   Insulin resistance 02/05/2018    PCP: [facility]  REFERRING PROVIDER: Lenice Llamas, FNP  REFERRING DIAG: LBP with pregnancy  THERAPY DIAG:  Other low back pain  Muscle weakness (generalized)  Abnormal posture  Other abnormalities of gait and mobility  Rationale for Evaluation and Treatment: Rehabilitation  ONSET DATE: 05/03/23 referral date  SUBJECTIVE:                                                                                                                                                                                           SUBJECTIVE STATEMENT: Pt reported everything has been hard, the more active she is the more painful it is.  Pt is wearing SIJ belt when she leaves the house. She  has been practicing txfs with less hip abd and performing exercises on exercise ball throughout the day. SIJ belt has not been donned at pt's appointments. Pt reported her OBGYN approved TDN at appt. On 07/18/23.    PAIN:  Are you having pain? Yes 07/26/23 NPRS scale: 6-7/10 at rest Pain location: L sided low back and L hip more painful today, PS pain with walking, and B hip pain  When pain is present: Pain type: aching, stabbing Pain description: constant   Aggravating factors: standing, getting in/out bed, bending forward to pick something up Relieving  factors: sitting, rest  PRECAUTIONS: Other: pregnant  RED FLAGS: None   WEIGHT BEARING RESTRICTIONS: No  FALLS:  Has patient fallen in last 6 months? No  LIVING ENVIRONMENT: Lives with: lives with their family, lives with their spouse, and lives with their son Lives in: House/apartment Stairs: Yes: External: 2 steps; none Has following equipment at home: None  OCCUPATION: SAHM  PLOF: Independent  PATIENT GOALS: I just want it to go away.  PERTINENT HISTORY:  No PHM noted. Sexual abuse: not asked yet.  BOWEL MOVEMENT: Pain with bowel movement: No Type of bowel movement:Frequency twice daily Fully empty rectum: Yes:   Leakage: No Pads: No Fiber supplement: No  URINATION: Pain with urination: No Fully empty bladder: No Stream: Strong Urgency: No Frequency: every 30-60 minutes and nocturia x4. Leakage: none Pads: No  INTERCOURSE: Pain with intercourse: Initial Penetration and During Penetration Ability to have vaginal penetration:  Yes: but has pain Climax: did not ask. Marinoff Scale: 1/3  PREGNANCY: Two sons (4 and 35 y/o) Vaginal deliveries 2 Tearing: she had tearing with both deliveries.  C-section deliveries  Currently pregnant Yes: 20 weeks  PROLAPSE: None   OBJECTIVE:    COGNITION: Overall cognitive status: Within functional limits for tasks assessed     SENSATION: Light touch: Appears  intact Proprioception: Appears intact   GAIT: Distance walked: 75'x2 Assistive device utilized: None Level of assistance: Complete Independence Comments: wide BOS, decr. Stride length, decr. Trunk rotation and pain with walking. PT donned SIJ belt on pt and pt reported it felt good.   POSTURE: increased lumbar lordosis, decreased thoracic kyphosis, and anterior pelvic tilt  PELVIC ALIGNMENT: see above, incr. Postural sway with R and L SLS (>LLE SLS)  LUMBARAROM/PROM:  A/PROM A/PROM  eval  Flexion WNL  Extension Limited with concordant pain  Right lateral flexion WNL with concordant pain  Left lateral flexion WNL  Right rotation WNL  Left rotation WNL   (Blank rows = not tested)  LOWER EXTREMITY ROM: WFL except for B hip IR. Pt then reported pubic symphysis pain when getting in and out of car.  Active ROM Right eval Left eval  Hip flexion    Hip extension    Hip abduction    Hip adduction    Hip internal rotation    Hip external rotation    Knee flexion    Knee extension    Ankle dorsiflexion    Ankle plantarflexion    Ankle inversion    Ankle eversion     (Blank rows = not tested)  LOWER EXTREMITY MMT:  MMT Right eval Left eval  Hip flexion 4/5 4/5  Hip extension    Hip abduction 3+/5 3+/5  Hip adduction 3+/5 3+/5  Hip internal rotation    Hip external rotation    Knee flexion 4/5 4/5  Knee extension 5/5 5/5  Ankle dorsiflexion 5/5 5/5  Ankle plantarflexion    Ankle inversion    Ankle eversion     PALPATION: See below.  PELVIC MMT: pt able to contract with breath coordination with cues.    MMT eval  Vaginal   Internal Anal Sphincter   External Anal Sphincter   Puborectalis   Diastasis Recti   (Blank rows = not tested)        TONE:   PROLAPSE:   TODAY'S TREATMENT:  DATE: 07/26/23    TA: PT again reiterated the  importance of performing txs (bed and car and STS) with small steps to decr. Pain. pt performed seated<>sidelying and rolling side to side on bed to with LEs together to decr. PS pain at night and practice getting "in and out of car" with small steps to decr. Pain. Cues and demo for proper technique. S for safety.  Patient ed: Cues and demo for proper technique. S for safety.  Manual therapy: PT palpated over L hip musculature (glutes and rotators) to find trigger points and reproduce pt's concordant pain. Pt provided informed consent to perform TDN. Performed with pt in R sidelying as unable to lie prone 2/2 pregnancy.  TDN Treatment: Dustin Flock)  Patient consent: After explanation of TDN Rationale, Procedures, outcomes, and potential side effects, patient verbalized consent to TDN treatment. Yes, and reported OBGYN approved it during appt. On 07/18/23.  Region/Dx: trigger points causing pain in L hip (glute max and rotators) Muscles Treated: L glute max and piriformis  Post treatment pain/response: No reaction at first TDN location but twitch response. Lightheadedness and nausea during second TDN location but ceased with rest. Pt reported pain decr. From 6-7/10 to 0/10 after TND during 20' of amb. Pt required rest in sidelying and seated positions and water to allow nausea to subside. Post treatment Instructions: Patient instructed to expect mild to moderate muscle soreness this evening and tomorrow. Patient instructed to continued prescribed home exercise program. If dry needling over lung field, patient instructed of signs and symptoms of pneumothorax (not performed). Patient also educated on signs and symptoms of infection, however unlikely, and to seek immediate medical attention shoulder thy occur. Patient verbalized understanding of these instructions.    SELF CARE: PATIENT EDUCATION:  Education details:PT reviewed HEP and how and when to perform after TDN. Person educated: Patient Education  method: Explanation, Demonstration, Verbal cues, and Handouts Education comprehension: verbalized understanding and needs further education  HOME EXERCISE PROGRAM: Med[employer]: [id number]  ASSESSMENT:  CLINICAL IMPRESSION: Today's skilled PT session focused on reducing L hip pain with TDN, as it is limiting pt's gait, ADLs and HEP 2/2 pain. Pt demonstrated progress during session as she reported L hip pain decr. Form 6-7/10 to 0/10 after TDN. Pt required incr. Rest time in sidelying and sitting 2/2 nausea after TDN, felt better after water and rest. The following impairments were continued to be noted upon exam: gait deviations, impaired SLS balance (L>R), postural dysfunction (incr. Ant. Pelvic tilt and lx spine lordosis and decr. Tx spine kyphosis), incr. Urinary frequency, back/hip/LLE pain. The pt will continue to benefit from skilled PT to improve deficits listed above to improve safety and QOL during all ADLs and caring for children.   OBJECTIVE IMPAIRMENTS: Abnormal gait, decreased balance, decreased coordination, decreased endurance, decreased mobility, decreased ROM, decreased strength, hypomobility, impaired flexibility, postural dysfunction, and pain.   ACTIVITY LIMITATIONS: carrying, lifting, bending, standing, squatting, sleeping, stairs, transfers, bed mobility, toileting, locomotion level, and caring for others  PARTICIPATION LIMITATIONS: meal prep, cleaning, laundry, interpersonal relationship, shopping, and community activity  PERSONAL FACTORS: Past/current experiences and 1 comorbidity: see above.  are also affecting patient's functional outcome.   REHAB POTENTIAL: Good  CLINICAL DECISION MAKING: Stable/uncomplicated  EVALUATION COMPLEXITY: Low   GOALS: Goals reviewed with patient? Yes  SHORT TERM GOALS: Target date: for all STGS: 06/07/23  Pt will be IND in HEP to improve pain, strength, coordination. Baseline: no HEP, 10/29: had to take a break 2/2 illness. Goal  status: PARTIALLY MET  2.  Finish exam and write goals as indicated. Baseline: limited 2/2 time constraints Goal status: MET  3.  Pt will demo proper toileting posture to fully empty bladder and reduce straining during bowel movement. Baseline: unable to demo, 10/29: feet are flat and pt leaning forward with hands on knees vs. Forearms 2/2 belly. Goal status: PARTIALLY MET   4.  Pt will complete FOTO and PT will write goal as indicated. Baseline: limited 2/2 time constraints Goal status: DEFERRED today.   LONG TERM GOALS: Target date: for all LTGS: 07/05/23. New POC: 08/14/23  Pt will demonstrate improved relaxation and contraction of pelvic floor muscles (PFM) with coordination of breath to decr. Pain with intercourse with spouse. Baseline: pain with intercourse Goal status: on-going  2.  Pt will experience improved bladder behaviors and ability to coordinate PFM contraction and relaxation with breath to report nocturia </=2/night. Baseline: 4x/night, 2x/night on 07/17/23 Goal status: on-going  3.  Pt will improve hip, PFM and core strength to improve LBP/hip/LE pain to </=2/10 at worst to perform ADLs. Baseline: 10/10 at worst Goal status: on-going   PLAN: dry needling prn, same progress strengthening and stretches, manual therapy prn  PT FREQUENCY: 1x/week  PT DURATION: 8 weeks  PLANNED INTERVENTIONS: Therapeutic exercises, Therapeutic activity, Neuromuscular re-education, Balance training, Gait training, Patient/Family education, Self Care, Joint mobilization, Joint manipulation, Stair training, Dry Needling, Spinal mobilization, Moist heat, Taping, Biofeedback, Manual therapy, and Re-evaluation     Leeta Grimme L, PT 07/26/2023, 10:27 AM  Zerita Boers, PT,DPT 07/26/23 10:27 AM Phone: [phone] Fax: [phone]

## 2023-08-01 ENCOUNTER — Ambulatory Visit: Payer: Medicaid Other

## 2023-08-01 ENCOUNTER — Ambulatory Visit: Payer: Medicaid Other | Admitting: Advanced Practice Midwife

## 2023-08-01 VITALS — BP 121/74 | Wt 220.4 lb

## 2023-08-01 DIAGNOSIS — Z3483 Encounter for supervision of other normal pregnancy, third trimester: Secondary | ICD-10-CM

## 2023-08-01 DIAGNOSIS — Z3480 Encounter for supervision of other normal pregnancy, unspecified trimester: Secondary | ICD-10-CM

## 2023-08-01 DIAGNOSIS — M5430 Sciatica, unspecified side: Secondary | ICD-10-CM

## 2023-08-01 NOTE — Progress Notes (Signed)
Patient aware of U/S 08/02/23 @ 1pm. BTHIELE RN

## 2023-08-01 NOTE — Progress Notes (Signed)
[facility]  PRENATAL VISIT NOTE  Subjective:  Lauren Stephens Farhana Koglin is a 32 y.o. G3P2002 at [redacted]w[redacted]d being seen today for ongoing prenatal care.  She is currently monitored for the following issues for this high-risk pregnancy and has Insulin resistance; Morbid obesity (HCC) BMI=42.3; Supervision of other normal pregnancy, antepartum; Ovarian cyst during pregnancy; Maternal obesity without hypertension; Rubella non-immune status, antepartum; UTI (urinary tract infection) during pregnancy Proteus mirabilis 02/07/23; and Sciatica on their problem list.  Patient reports  symphysis pubis discomfort .  Contractions: Not present. Vag. Bleeding: None.  Movement: Present. Denies leaking of fluid/ROM.   The following portions of the patient's history were reviewed and updated as appropriate: allergies, current medications, past family history, past medical history, past social history, past surgical history and problem list. Problem list updated.  Objective:   Vitals:   08/01/23 0846  BP: 121/74  Weight: 220 lb 6 oz (100 kg)    Fetal Status: Fetal Heart Rate (bpm): 132 Fundal Height: 35 cm Movement: Present     General:  Alert, oriented and cooperative. Patient is in no acute distress.  Skin: Skin is warm and dry. No rash noted.   Cardiovascular: Normal heart rate noted  Respiratory: Normal respiratory effort, no problems with respiration noted  Abdomen: Soft, gravid, appropriate for gestational age.  Pain/Pressure: Absent     Pelvic: Cervical exam deferred        Extremities: Normal range of motion.  Edema: None  Mental Status: Normal mood and affect. Normal behavior. Normal judgment and thought content.   Assessment and Plan:  Pregnancy: G3P2002 at [redacted]w[redacted]d  1. Supervision of other normal pregnancy, antepartum C/o constipation--suggestions given GERD resolved Has MFM u/s 08/02/23 growth and S>D Walking 2-3x/wk x 10 min but more  uncomfortable Not working C/o symphysis pubis pain; pt reassured and suggestions given  2. Morbid obesity (HCC) BMI=42.3 Taking ASA 81 mg daily -9 lb 10 oz (-4.366 kg) 1 lb wt loss in last 2 wks; denies vomiting, diarrhea   3. Sciatica, unspecified laterality Had PT apt 07/26/23 and can't remember when next apt is    Preterm labor symptoms and general obstetric precautions including but not limited to vaginal bleeding, contractions, leaking of fluid and fetal movement were reviewed in detail with the patient. Please refer to After Visit Summary for other counseling recommendations.  Return in about 2 weeks (around 08/15/2023) for routine PNC.  Future Appointments  Date Time Provider Department Center  08/02/2023  1:00 PM [facility]  08/09/2023 11:45 AM Sherren Kerns, PT [facility] None  08/11/2023 10:00 AM [facility] PROVIDER [facility] None    Alberteen Spindle, CNM

## 2023-08-02 ENCOUNTER — Other Ambulatory Visit: Payer: Self-pay

## 2023-08-02 ENCOUNTER — Ambulatory Visit: Payer: Medicaid Other | Attending: Obstetrics

## 2023-08-02 ENCOUNTER — Other Ambulatory Visit: Payer: Self-pay | Admitting: Obstetrics

## 2023-08-02 ENCOUNTER — Encounter: Payer: Self-pay | Admitting: Advanced Practice Midwife

## 2023-08-02 DIAGNOSIS — Z3480 Encounter for supervision of other normal pregnancy, unspecified trimester: Secondary | ICD-10-CM

## 2023-08-02 DIAGNOSIS — O99213 Obesity complicating pregnancy, third trimester: Secondary | ICD-10-CM | POA: Insufficient documentation

## 2023-08-02 DIAGNOSIS — Z363 Encounter for antenatal screening for malformations: Secondary | ICD-10-CM | POA: Insufficient documentation

## 2023-08-02 DIAGNOSIS — E669 Obesity, unspecified: Secondary | ICD-10-CM | POA: Diagnosis not present

## 2023-08-02 DIAGNOSIS — O26843 Uterine size-date discrepancy, third trimester: Secondary | ICD-10-CM | POA: Insufficient documentation

## 2023-08-02 DIAGNOSIS — O288 Other abnormal findings on antenatal screening of mother: Secondary | ICD-10-CM | POA: Insufficient documentation

## 2023-08-02 DIAGNOSIS — Z3A32 32 weeks gestation of pregnancy: Secondary | ICD-10-CM | POA: Diagnosis not present

## 2023-08-05 ENCOUNTER — Encounter: Payer: Self-pay | Admitting: Obstetrics and Gynecology

## 2023-08-05 ENCOUNTER — Observation Stay: Payer: Medicaid Other

## 2023-08-05 ENCOUNTER — Inpatient Hospital Stay
Admission: EM | Admit: 2023-08-05 | Discharge: 2023-08-12 | DRG: 788 | Disposition: A | Discharge status: Home / Self Care | Payer: Medicaid Other | Attending: Obstetrics | Admitting: Obstetrics

## 2023-08-05 ENCOUNTER — Other Ambulatory Visit: Payer: Self-pay

## 2023-08-05 DIAGNOSIS — O99214 Obesity complicating childbirth: Secondary | ICD-10-CM | POA: Diagnosis present

## 2023-08-05 DIAGNOSIS — O2343 Unspecified infection of urinary tract in pregnancy, third trimester: Secondary | ICD-10-CM

## 2023-08-05 DIAGNOSIS — O09899 Supervision of other high risk pregnancies, unspecified trimester: Secondary | ICD-10-CM

## 2023-08-05 DIAGNOSIS — O42913 Preterm premature rupture of membranes, unspecified as to length of time between rupture and onset of labor, third trimester: Principal | ICD-10-CM | POA: Diagnosis present

## 2023-08-05 DIAGNOSIS — E66813 Obesity, class 3: Secondary | ICD-10-CM | POA: Diagnosis present

## 2023-08-05 DIAGNOSIS — N898 Other specified noninflammatory disorders of vagina: Secondary | ICD-10-CM | POA: Diagnosis present

## 2023-08-05 DIAGNOSIS — O321XX0 Maternal care for breech presentation, not applicable or unspecified: Secondary | ICD-10-CM | POA: Diagnosis present

## 2023-08-05 DIAGNOSIS — O99210 Obesity complicating pregnancy, unspecified trimester: Principal | ICD-10-CM

## 2023-08-05 DIAGNOSIS — Z2839 Other underimmunization status: Secondary | ICD-10-CM

## 2023-08-05 DIAGNOSIS — Z7982 Long term (current) use of aspirin: Secondary | ICD-10-CM

## 2023-08-05 DIAGNOSIS — Z3A33 33 weeks gestation of pregnancy: Secondary | ICD-10-CM

## 2023-08-05 DIAGNOSIS — O26893 Other specified pregnancy related conditions, third trimester: Secondary | ICD-10-CM | POA: Diagnosis present

## 2023-08-05 DIAGNOSIS — O4191X3 Disorder of amniotic fluid and membranes, unspecified, first trimester, fetus 3: Secondary | ICD-10-CM

## 2023-08-05 DIAGNOSIS — O3443 Maternal care for other abnormalities of cervix, third trimester: Secondary | ICD-10-CM

## 2023-08-05 DIAGNOSIS — Z833 Family history of diabetes mellitus: Secondary | ICD-10-CM

## 2023-08-05 LAB — URINALYSIS, ROUTINE W REFLEX MICROSCOPIC
Bilirubin Urine: NEGATIVE
Glucose, UA: NEGATIVE mg/dL
Hgb urine dipstick: NEGATIVE
Ketones, ur: NEGATIVE mg/dL
Nitrite: NEGATIVE
Protein, ur: 30 mg/dL — AB
Specific Gravity, Urine: 1.01 (ref 1.005–1.030)
Squamous Epithelial / HPF: >50 /[HPF] (ref 0–5)
pH: 7 (ref 5.0–8.0)

## 2023-08-05 LAB — TYPE AND SCREEN
ABO/RH(D): O POS
Antibody Screen: NEGATIVE

## 2023-08-05 LAB — CBC
HCT: 40.9 % (ref 36.0–46.0)
Hemoglobin: 13.7 g/dL (ref 12.0–15.0)
MCH: 27.5 pg (ref 26.0–34.0)
MCHC: 33.5 g/dL (ref 30.0–36.0)
MCV: 82.1 fL (ref 80.0–100.0)
Platelets: 276 10*3/uL (ref 150–400)
RBC: 4.98 MIL/uL (ref 3.87–5.11)
RDW: 14.5 % (ref 11.5–15.5)
WBC: 10.3 10*3/uL (ref 4.0–10.5)
nRBC: 0 % (ref 0.0–0.2)

## 2023-08-05 LAB — CHLAMYDIA/NGC RT PCR (ARMC ONLY)
Chlamydia Tr: NOT DETECTED
N gonorrhoeae: NOT DETECTED

## 2023-08-05 LAB — FETAL FIBRONECTIN: Fetal Fibronectin: POSITIVE — AB

## 2023-08-05 LAB — WET PREP, GENITAL
Clue Cells Wet Prep HPF POC: NONE SEEN
Sperm: NONE SEEN
Trich, Wet Prep: NONE SEEN
WBC, Wet Prep HPF POC: <10 (ref <10)
Yeast Wet Prep HPF POC: NONE SEEN

## 2023-08-05 LAB — RUPTURE OF MEMBRANE (ROM)PLUS: Rom Plus: POSITIVE

## 2023-08-05 LAB — GROUP B STREP BY PCR: Group B strep by PCR: NEGATIVE

## 2023-08-05 MED ORDER — BETAMETHASONE SOD PHOS & ACET 6 (3-3) MG/ML IJ SUSP
12.0000 mg | INTRAMUSCULAR | Status: AC
Start: 2023-08-05 — End: 2023-08-07
  Administered 2023-08-05 – 2023-08-06 (×2): 12 mg via INTRAMUSCULAR
  Filled 2023-08-05: qty 5

## 2023-08-05 MED ORDER — NIFEDIPINE 10 MG PO CAPS: 20.0000 mg | ORAL_CAPSULE | ORAL | Status: DC

## 2023-08-05 MED ORDER — SODIUM CHLORIDE 0.9 % IV SOLN
2.0000 g | Freq: Four times a day (QID) | INTRAVENOUS | Status: AC
  Administered 2023-08-05 – 2023-08-07 (×8): 2 g via INTRAVENOUS
  Filled 2023-08-05 (×9): qty 2000

## 2023-08-05 MED ORDER — NIFEDIPINE 10 MG PO CAPS: 20.0000 mg | ORAL_CAPSULE | Freq: Four times a day (QID) | ORAL | Status: DC

## 2023-08-05 MED ORDER — LACTATED RINGERS IV SOLN: INTRAVENOUS | Status: AC

## 2023-08-05 MED ORDER — NIFEDIPINE 10 MG PO CAPS: 60.0000 mg | ORAL_CAPSULE | Freq: Once | ORAL | Status: DC

## 2023-08-05 MED ORDER — TERBUTALINE SULFATE 1 MG/ML IJ SOLN
0.2500 mg | Freq: Once | INTRAMUSCULAR | Status: AC | PRN
  Administered 2023-08-09: 0.25 mg via SUBCUTANEOUS
  Filled 2023-08-05: qty 1

## 2023-08-05 MED ORDER — ACETAMINOPHEN 500 MG PO TABS
1000.0000 mg | ORAL_TABLET | Freq: Four times a day (QID) | ORAL | Status: DC | PRN
  Administered 2023-08-06 – 2023-08-07 (×2): 1000 mg via ORAL
  Filled 2023-08-05 (×2): qty 2

## 2023-08-05 MED ORDER — NIFEDIPINE 10 MG PO CAPS
30.0000 mg | ORAL_CAPSULE | Freq: Once | ORAL | Status: AC
  Administered 2023-08-05: 30 mg via ORAL
  Filled 2023-08-05: qty 3

## 2023-08-05 MED ORDER — LACTATED RINGERS IV SOLN: 125.0000 mL/h | INTRAVENOUS | Status: DC

## 2023-08-05 MED ORDER — NIFEDIPINE 10 MG PO CAPS
20.0000 mg | ORAL_CAPSULE | Freq: Four times a day (QID) | ORAL | Status: DC
  Administered 2023-08-06: 20 mg via ORAL
  Filled 2023-08-05: qty 2

## 2023-08-05 MED ORDER — SODIUM CHLORIDE 0.9 % IV SOLN
500.0000 mg | INTRAVENOUS | Status: AC
  Administered 2023-08-05 – 2023-08-06 (×2): 500 mg via INTRAVENOUS
  Filled 2023-08-05 (×2): qty 5

## 2023-08-05 NOTE — H&P (Cosign Needed Addendum)
ANTEPARTUM ADMISSION H&P NOTE   History of Present Illness: Lauren Stephens is a 32 y.o. G3P2002 at [redacted]w[redacted]d admitted for PPROM.  She presented to L&D triage on 08/05/2023 with concerns of increased vaginal discharge. Reported that she was sitting at home and noticed sudden moisture in her undergarments. She stated that she "doesn't think her water broke, but thinks she has increased vaginal discharge." Described her vaginal discharge to be yellow and reports mild vaginal itching. Denies having to wear a pad. Denies vaginal bleeding and contractions/cramping. Endorses active fetal movement. ROM Plus and fetal fibronectin labs were collected on 08/05/2023 and yielded a positive result.   Patient Active Problem List   Diagnosis Date Noted   Preterm premature rupture of membranes in third trimester 08/05/2023   AFI (amniotic fluid index) borderline low-normal on 08/02/23 u/s (6.89 cm total volume) 08/02/2023   Sciatica 05/23/2023   Rubella non-immune status, antepartum 02/14/2023   UTI (urinary tract infection) during pregnancy Proteus mirabilis 02/07/23 02/14/2023   Supervision of other normal pregnancy, antepartum 02/07/2023   Ovarian cyst during pregnancy 02/07/2023   Maternal obesity without hypertension 02/07/2023   Morbid obesity (HCC) BMI=42.3 08/01/2019   Insulin resistance 02/05/2018    Past Medical History:  Diagnosis Date   Bacterial vaginosis    10/2022  [facility]   Infertility, female    f-u [facility]    History reviewed. No pertinent surgical history.  OB History  Gravida Para Term Preterm AB Living  3 2 2  0 0 2  SAB IAB Ectopic Multiple Live Births  0 0 0 0 2    # Outcome Date GA Lbr Len/2nd Weight Sex Type Anes PTL Lv  3 Current           2 Term 08/08/18    M    LIV  1 Term 10/10/14    M    LIV    Social History   Socioeconomic History   Marital status: Married    Spouse name: Wynona Canes   Number of children: 2   Years of  education: 12   Highest education level: Some college, no degree  Occupational History   Not on file  Tobacco Use   Smoking status: Never   Smokeless tobacco: Never  Vaping Use   Vaping status: Never Used  Substance and Sexual Activity   Alcohol use: No   Drug use: Never   Sexual activity: Yes    Partners: Male    Birth control/protection: Surgical    Comment: implant removed 2021  Other Topics Concern   Not on file  Social History Narrative   Not on file   Social Drivers of Health   Financial Resource Strain: Not on file  Food Insecurity: No Food Insecurity (02/07/2023)   Hunger Vital Sign    Worried About Running Out of Food in the Last Year: Never true    Ran Out of Food in the Last Year: Never true  Transportation Needs: No Transportation Needs (02/07/2023)   PRAPARE - Administrator, Civil Service (Medical): No    Lack of Transportation (Non-Medical): No  Physical Activity: Not on file  Stress: Not on file  Social Connections: Not on file   Family History  Problem Relation Age of Onset   Arthritis Mother    Migraines Mother    Asthma Sister    Asthma Son    Arthritis Maternal Grandmother    Diabetes Maternal Grandmother    Asthma Child  Asthma Half-Brother     No Known Allergies  Medications Prior to Admission  Medication Sig Dispense Refill Last Dose/Taking   aspirin EC 81 MG tablet Take 81 mg by mouth daily. Swallow whole.   08/04/2023   Prenatal Vit-Fe Fumarate-FA (PRENATAL PLUS VITAMIN/MINERAL) 27-1 MG TABS Take 1 tablet by mouth daily. 90 tablet 3 08/04/2023   acetaminophen (TYLENOL) 500 MG tablet Take 1,000 mg by mouth every 6 (six) hours as needed. (Patient not taking: Reported on 07/04/2023)      amoxicillin-clavulanate (AUGMENTIN) 875-125 MG tablet Take 1 tablet by mouth 2 (two) times daily. (Patient not taking: Reported on 07/18/2023) 14 tablet 0    clotrimazole-betamethasone (LOTRISONE) cream Apply 1 Application topically daily.  (Patient not taking: Reported on 07/04/2023) 30 g 0    ondansetron (ZOFRAN-ODT) 4 MG disintegrating tablet Take 1 tablet (4 mg total) by mouth every 8 (eight) hours as needed for nausea or vomiting. (Patient not taking: Reported on 03/07/2023) 20 tablet 0    promethazine (PHENERGAN) 25 MG tablet Take 0.5 tablets (12.5 mg total) by mouth every 4 (four) hours as needed for nausea or vomiting. Take 1/2 to 1 tablet Q 4-6 hrs as needed for nausea and vomiting (Patient not taking: Reported on 05/02/2023) 15 tablet 0     Review of Systems  Constitutional: Negative.   Respiratory: Negative.    Cardiovascular: Negative.   Gastrointestinal:  Negative for abdominal pain (Denies cramping/contractions).  Genitourinary:  Negative for dysuria, flank pain and urgency.       Reports increase yellow vaginal discharge. Denies vaginal bleeding.   Neurological: Negative.   Psychiatric/Behavioral: Negative.       Vitals:  BP 121/62 (BP Location: Left Arm)   Pulse 83   Temp 98 F (36.7 C) (Oral)   Resp 16   Ht 5\' 1"  (1.549 m)   Wt 99.8 kg   LMP 11/17/2022   BMI 41.57 kg/m   Physical Examination: Constitutional: NAD, AAOx3  HE/ENT: extraocular movements grossly intact, moist mucous membranes CV: RRR PULM: nl respiratory effort, CTABL Abd: gravid, non-tender, non-distended, soft  Ext: Non-tender, Nonedmeatous Psych: mood appropriate, speech normal Pelvic : cervix normal in appearance, external genitalia normal, no adnexal masses or tenderness, no bladder tenderness, no cervical motion tenderness, urethra without abnormality    SVE:  0.5/thick/ballotable by Roney Jaffe, CNM  SSE: moderate amount of physiologic discharge, pooling noted during speculum exam, no vaginal bleeding   Membranes: ruptured, clear fluid approximately around 1230 on 08/05/2023  NST: Baseline FHR: 130 beats/min Variability: moderate Accelerations: present Decelerations: absent Tocometry: occasional x1  Interpretation:   INDICATIONS: rule out uterine contractions RESULTS:  A NST procedure was performed with FHR monitoring and a normal baseline established, appropriate time of 20-40 minutes of evaluation, and accels >2 seen w 15x15 characteristics.  Results show a REACTIVE NST.   Labs:  Results for orders placed or performed during the hospital encounter of 08/05/23 (from the past 24 hours)  Wet prep, genital   Collection Time: 08/05/23  2:48 PM   Specimen: Vaginal  Result Value Ref Range   Yeast Wet Prep HPF POC NONE SEEN NONE SEEN   Trich, Wet Prep NONE SEEN NONE SEEN   Clue Cells Wet Prep HPF POC NONE SEEN NONE SEEN   WBC, Wet Prep HPF POC <10 <10   Sperm NONE SEEN   Chlamydia/NGC rt PCR ([facility] only)   Collection Time: 08/05/23  2:48 PM   Specimen: Vaginal  Result Value Ref Range  Specimen source GC/Chlam ENDOCERVICAL    Chlamydia Tr NOT DETECTED NOT DETECTED   N gonorrhoeae NOT DETECTED NOT DETECTED  Rupture of Membrane (ROM) Plus   Collection Time: 08/05/23  2:49 PM  Result Value Ref Range   Rom Plus POSITIVE   Fetal fibronectin   Collection Time: 08/05/23  2:49 PM  Result Value Ref Range   Fetal Fibronectin POSITIVE (A) NEGATIVE  Urinalysis, Routine w reflex microscopic -Urine, Clean Catch   Collection Time: 08/05/23  2:49 PM  Result Value Ref Range   Color, Urine YELLOW (A) YELLOW   APPearance CLOUDY (A) CLEAR   Specific Gravity, Urine 1.010 1.005 - 1.030   pH 7.0 5.0 - 8.0   Glucose, UA NEGATIVE NEGATIVE mg/dL   Hgb urine dipstick NEGATIVE NEGATIVE   Bilirubin Urine NEGATIVE NEGATIVE   Ketones, ur NEGATIVE NEGATIVE mg/dL   Protein, ur 30 (A) NEGATIVE mg/dL   Nitrite NEGATIVE NEGATIVE   Leukocytes,Ua TRACE (A) NEGATIVE   RBC / HPF 6-10 0 - 5 RBC/hpf   WBC, UA 11-20 0 - 5 WBC/hpf   Bacteria, UA RARE (A) NONE SEEN   Squamous Epithelial / HPF >50 0 - 5 /HPF   Mucus PRESENT   Group B strep by PCR   Collection Time: 08/05/23  3:16 PM   Specimen: Vaginal/Rectal; Genital  Result  Value Ref Range   Group B strep by PCR PRESUMPTIVE NEGATIVE PRESUMPTIVE NEGATIVE  CBC   Collection Time: 08/05/23  4:23 PM  Result Value Ref Range   WBC 10.3 4.0 - 10.5 K/uL   RBC 4.98 3.87 - 5.11 MIL/uL   Hemoglobin 13.7 12.0 - 15.0 g/dL   HCT 19.1 47.8 - 29.5 %   MCV 82.1 80.0 - 100.0 fL   MCH 27.5 26.0 - 34.0 pg   MCHC 33.5 30.0 - 36.0 g/dL   RDW 62.1 30.8 - 65.7 %   Platelets 276 150 - 400 K/uL   nRBC 0.0 0.0 - 0.2 %  Type and screen   Collection Time: 08/05/23  4:23 PM  Result Value Ref Range   ABO/RH(D) O POS    Antibody Screen NEG    Sample Expiration      08/08/2023,2359 Performed at [facility], [address] [place], [place] [address]     Imaging Studies: US FETAL BPP WO NON STRESS Result Date: 08/05/2023 CLINICAL DATA:  Fluid leaking, 33- weeks by LMP. EXAM: LIMITED OBSTETRIC ULTRASOUND AND BIOPHYSICAL PROFILE COMPARISON:  08/02/2023. FINDINGS: There is a singleton intrauterine pregnancy breechpresentation. Placenta position is anteriorwith no previa. There is adequate amniotic fluid. AFI is 3.1 cm (normal range for 32 weeks 8.6 cm to 24.2 cm). Cervix is longand closed. Anatomic evaluation limited by gestational age. Fetal heart motion 126BPM. Anatomic evaluation limited by gestational age and lack of fluid. BPD: 8.0cm = 32weeks Ultrasound [facility] 09/30/2023 BIOPHYSICAL PROFILE Movement: 2 time: 30 minutes Breathing: 0 Tone:  2 Amniotic Fluid: 0 Total Score:  4/8 IMPRESSION: 1. Single viable intrauterine pregnancy, breech presentation measuring 32 weeks by BPD. 2. Oligohydramnios. 3. Biophysical profile score is 4 out of 8. Electronically Signed   By: Layla Maw M.D.   On: 08/05/2023 19:01   US OB Limited Result Date: 08/05/2023 CLINICAL DATA:  Fluid leaking, 33- weeks by LMP. EXAM: LIMITED OBSTETRIC ULTRASOUND AND BIOPHYSICAL PROFILE COMPARISON:  08/02/2023. FINDINGS: There is a singleton intrauterine pregnancy breechpresentation. Placenta position is  anteriorwith no previa. There is adequate amniotic fluid. AFI is 3.1 cm (normal range for  32 weeks 8.6 cm to 24.2 cm). Cervix is longand closed. Anatomic evaluation limited by gestational age. Fetal heart motion 126BPM. Anatomic evaluation limited by gestational age and lack of fluid. BPD: 8.0cm = 32weeks Ultrasound [facility] 09/30/2023 BIOPHYSICAL PROFILE Movement: 2 time: 30 minutes Breathing: 0 Tone:  2 Amniotic Fluid: 0 Total Score:  4/8 IMPRESSION: 1. Single viable intrauterine pregnancy, breech presentation measuring 32 weeks by BPD. 2. Oligohydramnios. 3. Biophysical profile score is 4 out of 8. Electronically Signed   By: Layla Maw M.D.   On: 08/05/2023 19:01   [place] MFM OB DETAIL +14 WK Result Date: 08/02/2023 ----------------------------------------------------------------------  OBSTETRICS REPORT                       (Signed Final 08/02/2023 02:58 pm) ---------------------------------------------------------------------- Patient Info  ID #:       [id number]                          D.O.B.:  1991/02/28 (32 yrs)(F)  Name:       Lauren Stephens                Visit Date: 08/02/2023 01:34 pm              Lauren Stephens ---------------------------------------------------------------------- Performed By  Attending:        Ma Rings MD         Ref. Address:     [address],                                                             [place], [place]                                                             [address]  Performed By:     Eden Lathe BS      [facility]T                                 [facility] at                                                             [facility]  Referred By:      Austin Miles  SCIORA CNM ---------------------------------------------------------------------- Orders  #  Description                           Code        Ordered By  1   [place] MFM OB DETAIL +14 WK               [id number]    Arnetha Courser ----------------------------------------------------------------------  #  Order #                     Accession #                Episode #  1  [id number]                   [id number]                 [id number] ---------------------------------------------------------------------- Indications  Obesity complicating pregnancy, third          O99.213  trimester (pregravid BMI 43)  Uterine size-date discrepancy, third trimester O26.843  (S>D)  [redacted] weeks gestation of pregnancy                Z3A.32  Antenatal screening for malformations          Z36.3  Neg MatT21/AFP ---------------------------------------------------------------------- Fetal Evaluation  Num Of Fetuses:         1  Fetal Heart Rate(bpm):  129  Cardiac Activity:       Observed  Presentation:           Transverse, head to maternal right  Placenta:               Fundal  P. Cord Insertion:      Not well visualized  Amniotic Fluid  AFI FV:      Subjectively low-normal  AFI Sum(cm)     %Tile       Largest Pocket(cm)  6.89            < 3         4.87  RUQ(cm)       RLQ(cm)       LUQ(cm)        LLQ(cm)  0             2.02          0              4.87 ---------------------------------------------------------------------- Biometry  BPD:     72.79  mm     G. Age:  29w 1d        < 1  %    CI:        64.23   %    70 - 86                                                          FL/HC:      19.3   %    19.1 - 21.3  HC:    292.21   mm     G. Age:  32w 2d         16  %    HC/AC:      1.02        0.96 - 1.17  AC:  286.3  mm     G. Age:  32w 5d         64  %    FL/BPD:     77.6   %    71 - 87  FL:       56.5  mm     G. Age:  29w 5d        1.5  %    FL/AC:      19.7   %    20 - 24  CER:      39.7  mm     G. Age:  32w 1d         28  %  LV:        1.6  mm  CM:        5.1  mm  Est. FW:    1763  gm    3 lb 14 oz      19  % ---------------------------------------------------------------------- OB History   Gravidity:    3         Term:   2        Prem:   0        SAB:   0  TOP:          0       Ectopic:  0        Living: 2 ---------------------------------------------------------------------- Gestational Age  LMP:           36w 6d        Date:  11/17/22                  EDD:   08/24/23  U/S Today:     31w 0d                                        EDD:   10/04/23  Best:          32w 1d     Det. ByMarcella Dubs         EDD:   09/26/23                                      (02/01/23) ---------------------------------------------------------------------- Targeted Anatomy  Central Nervous System  Calvarium/Cranial V.:  Appears normal         Cereb./Vermis:          Appears normal  Cavum:                 Appears normal         Cisterna Magna:         Appears normal  Lateral Ventricles:    Appears normal         Midline Falx:           Appears normal  Choroid Plexus:        Appears normal  Spine  Cervical:              Not well visualized    Sacral:                 Not well visualized  Thoracic:              Not well visualized    Shape/Curvature:  Appears normal  Lumbar:                Not well visualized  Head/Neck  Lips:                  Not well visualized    Profile:                Appears normal  Neck:                  Appears normal         Orbits/Eyes:            Not well visualized  Nuchal Fold:           Not applicable         Mandible:               Appears normal  Nasal Bone:            Appears normal         Maxilla:                Appears normal  Thorax  4 Chamber View:        Appears normal         Interventr. Septum:     Appears normal  Cardiac Rhythm:        Normal                 Cardiac Axis:           Normal  Cardiac Situs:         Appears normal         Diaphragm:              Appears normal  Rt Outflow Tract:      Appears normal         3 Vessel View:          Appears normal  Lt Outflow Tract:      Appears normal         3 V Trachea View:       Appears normal  Aortic Arch:           Not well  visualized    IVC:                    Appears normal  Ductal Arch:           Not well visualized    Crossing:               Appears normal  SVC:                   Appears normal  Abdomen  Ventral Wall:          Not well visualized    Lt Kidney:              Appears normal  Cord Insertion:        Not well visualized    Rt Kidney:              Appears normal  Situs:                 Appears normal         Bladder:                Appears normal  Stomach:               Appears normal  Extremities  Lt Humerus:            Not well visualized    Lt Femur:               Appears normal  Rt Humerus:            Appears normal         Rt Femur:               Appears normal  Lt Forearm:            Not well visualized    Lt Lower Leg:           Appears normal  Rt Forearm:            Appears normal         Rt Lower Leg:           Appears normal  Lt Hand:               Not well visualized    Lt Foot:                Not well visualized  Rt Hand:               Not well visualized    Rt Foot:                Not well visualized  Other  Umbilical Cord:        Normal 3-vessel        Genitalia:              Female-nml  Comment:     Technically difficult due to maternal habitus and fetal position. ---------------------------------------------------------------------- Cervix Uterus Adnexa  Cervix  Not visualized (advanced GA >24wks)  Uterus  No abnormality visualized.  Right Ovary  Not visualized.  Left Ovary  Not visualized.  Cul De Sac  No free fluid seen.  Adnexa  No abnormality visualized ---------------------------------------------------------------------- Comments  This patient was seen due to maternal obesity with a BMI of  43.  The patient reports that her fundal heights have been  measuring greater than her dates.  She denies any problems in her current pregnancy.  She had a cell free DNA test earlier in her pregnancy which  indicated a low risk for trisomy 53, 55, and 13. A female fetus is  predicted.  She was informed that the fetal  growth (19th percentile) was  appropriate for her gestational age.  Low normal amniotic  fluid with a total AFI of 6.89 cm was noted.  A 5 cm maximal  vertical pocket of fluid was present today.  The views of the fetal anatomy were limited today.  However,  there were no obvious fetal anomalies were noted on today's  exam.  The patient was informed that anomalies may be missed due  to technical limitations. If the fetus is in a suboptimal position  or maternal habitus is increased, visualization of the fetus in  the maternal uterus may be impaired.  Due to maternal obesity, weekly fetal testing is recommended  starting at around 34 weeks.  She will return in 2 weeks for a BPP. ----------------------------------------------------------------------                   Ma Rings, MD Electronically Signed Final Report   08/02/2023 02:58 pm ----------------------------------------------------------------------   Assessment and Plan: 32 y.o. [id number] at [redacted]w[redacted]d admitted for PPROM. 1. Admit to Antepartum   - [facility] notified of admission and reviewed plan of care   2. Routine OB: - Rh positive  - CBC, RPR, Type and Screen, and UA ordered (08/05/2023) - GC/C--> Negative (08/05/2023) - Regular diet, continuous IV fluids  3. Fetal Well being  - Fetal Tracing: Category I - Group B Strep ordered; Results: Pending (08/05/2023) - Presentation: Breech confirmed by US OB Limited and Leopold's (08/05/2023)   3. Monitoring of labor  - Contractions monitored with external toco - Continuous fetal monitoring  - Pain management: None  4.  PPROM    - Fetal fibronectin--> Positive (08/05/2023) - ROM Plus-->Positive (08/05/2023) - Fern test--> Negative (08/05/2023) - Wet prep --> Negative (08/05/2023) - NST reactive (08/05/2023) - US Fetal BPP WO NON Stress ordered (08/05/2023)  - BBP 4/8 (0 breathing, 0 amniotic fluid)  - Repeat BPP in 24 hours  - US OB limited ordered  - AFI: 6.89 cm (08/02/2023) ,  AFI: 3.1 cm--> Oligohydramnios                (08/05/2023)  - Cervix long and closed  - [facility] algorithm for PPROM initiated    - Ampicillin 2g IV q6hrs for first 48 hours ordered  - Azithromycin 500 mg IV q24 hours x 2 days ordered  - Betamethosone 12 mg q24 hours ordered  - Cervical exams to be performed as appropriate  - NICU Consult ordered (08/05/2023)  Routine antenatal care  Lauren Stephens, CNM

## 2023-08-05 NOTE — OB Triage Note (Signed)
Pt states she was sitting at home and noticed sudden moisture in her underware. Pt states she doesn't think her water broke but thinks she has in increased vaginal discharge. Denies odor. States color Is yellowish. Denies having to wear a pad. Denies vaginal bleeding, or contractions/cramping. Reports + fetal movement. Elaina Hoops

## 2023-08-05 NOTE — Consult Note (Signed)
[facility]    Prenatal Consult       08/05/2023  8:08 PM   I was asked by Cline Crock, CNM to consult on this patient for possible preterm delivery.  I had the pleasure of meeting with Lauren Stephens and her husband today.  She is a 32 y.o. G3P2002 at [redacted]w[redacted]d. She is currently monitored for the following issues for this high-risk pregnancy and has Insulin resistance; Morbid obesity (HCC) BMI=42.3; Supervision of other normal pregnancy, antepartum; Ovarian cyst during pregnancy; Maternal obesity without hypertension; Rubella non-immune status, antepartum; UTI (urinary tract infection) during pregnancy Proteus mirabilis 02/07/23; and Sciatica on their problem list. She is being admitted for preterm ROM. The goal of treatment is to remain pregnant until 34 weeks. She is receiving betamethasone and antibiotics.  I explained that the neonatal intensive care team would be present for the delivery and outlined the likely delivery room course for this baby including routine resuscitation and NRP-guided approaches to the treatment of respiratory distress. We discussed other common problems associated with prematurity including respiratory distress syndrome/CLD, apnea, feeding issues, temperature regulation, and infection risk.     We discussed the average length of stay but I noted that the actual LOS would depend on the severity of problems encountered and response to treatments.  We discussed visitation policies and the resources available while her child is in the hospital.  We discussed the importance of good nutrition and various methods of providing nutrition (parenteral hyperalimentation, gavage feedings and/or oral feeding). We discussed the benefits of human milk. I encouraged breast feeding and pumping soon after birth and outlined resources that are available to support breast feeding.    Thank you for involving [place] in the care of this patient. A member of our team will be available  should the family have additional questions.  Time for consultation approximately 20 minutes.   _____________________ Electronically Signed By: Kyla Balzarine, NNP-BC

## 2023-08-05 NOTE — Progress Notes (Cosign Needed Addendum)
Antepartum Progress Note   Lauren Stephens is a 32 y.o. G3P2002 at [redacted]w[redacted]d admitted for PPROM.   Subjective:  Patient reports discomfort. She states she feels her contractions now.  Denies vaginal bleeding.   Objective:   Vitals:   08/05/23 1350 08/05/23 1357 08/05/23 1822  BP: 118/74  121/62  Pulse: 90  83  Resp: 16  16  Temp: 98.3 F (36.8 C)  98 F (36.7 C)  TempSrc: Oral  Oral  Weight:  99.8 kg   Height:  5\' 1"  (1.549 m)     Current Vital Signs 24h Vital Sign Ranges  T 98 F (36.7 C) Temp  Avg: 98.2 F (36.8 C)  Min: 98 F (36.7 C)  Max: 98.3 F (36.8 C)  BP 121/62 BP  Min: 118/74  Max: 121/62  HR 83 Pulse  Avg: 86.5  Min: 83  Max: 90  RR 16 Resp  Avg: 16  Min: 16  Max: 16  SaO2     No data recorded      Gen: alert, cooperative, no distress FHR: Baseline: 135 bpm, Variability: moderate, Accels: Present, Decels: none Toco: regular, every 2-6 minutes Abdomen: nontender and no pain elicited to palpation SVE: Deferred Membranes: SROM approximately around 1230 on 12/14, clear fluid  Medications SCHEDULED MEDICATIONS   betamethasone acetate-betamethasone sodium phosphate  12 mg Intramuscular Q24H    MEDICATION INFUSIONS   ampicillin (OMNIPEN) IV 2 g (08/05/23 1825)   azithromycin 500 mg (08/05/23 1658)   lactated ringers 125 mL/hr at 08/05/23 1615   [START ON 08/06/2023] lactated ringers      PRN MEDICATIONS  acetaminophen, terbutaline   Assessment & Plan:  32 y.o. [id number] at [redacted]w[redacted]d admitted for PPROM.  - PPROM  - Contractions occurring q2-6 minutes observed on FHR tracing. Per patient,                  she is feeling contractions now and discomfort. Contractions mild to                palpation and soft resting tone. Abdomen nontender and no pain elicited to                palpation. No vaginal bleeding.    - Tocolytic therapy initiated:   - 30 mg loading dose Procardia given. Followed by a maintenance                            regimen of 20 mg q 4  hours until contractions subside. Dosing                            interval will be extended as indicated.  - 1st dose of BMZ given on 12/14 @ 1700  - Latency antibiotics infusing (ampicillin 2g x1 and azithromycin 500 mg x1)  - Cervical exams to be performed as appropriate  - Repeat CBC in AM to monitor WBC  - Afebrile  - WBC--> 10.3 (08/05/2023) - BPP 4/8 (08/05/2023)--> Repeat BPP in 24 hours.  - NICU NP at bedside.  - Fetal Well-being: Category I - GBS: negative - GC/C: negative  - HIV: negative - Pain control: 1,000 mg Tylenol q 6hrs prn  - Plan of care reviewed with Virgel Manifold, MD  Roney Jaffe, CNM  08/05/2023 8:59 PM  [facility]

## 2023-08-06 DIAGNOSIS — Z3483 Encounter for supervision of other normal pregnancy, third trimester: Secondary | ICD-10-CM | POA: Diagnosis not present

## 2023-08-06 DIAGNOSIS — N898 Other specified noninflammatory disorders of vagina: Secondary | ICD-10-CM | POA: Diagnosis present

## 2023-08-06 DIAGNOSIS — Z7982 Long term (current) use of aspirin: Secondary | ICD-10-CM | POA: Diagnosis not present

## 2023-08-06 DIAGNOSIS — E66813 Obesity, class 3: Secondary | ICD-10-CM | POA: Diagnosis present

## 2023-08-06 DIAGNOSIS — Z3A33 33 weeks gestation of pregnancy: Secondary | ICD-10-CM | POA: Diagnosis not present

## 2023-08-06 DIAGNOSIS — O99214 Obesity complicating childbirth: Secondary | ICD-10-CM | POA: Diagnosis present

## 2023-08-06 DIAGNOSIS — O26893 Other specified pregnancy related conditions, third trimester: Secondary | ICD-10-CM | POA: Diagnosis present

## 2023-08-06 DIAGNOSIS — Z833 Family history of diabetes mellitus: Secondary | ICD-10-CM | POA: Diagnosis not present

## 2023-08-06 DIAGNOSIS — O42913 Preterm premature rupture of membranes, unspecified as to length of time between rupture and onset of labor, third trimester: Secondary | ICD-10-CM | POA: Diagnosis present

## 2023-08-06 DIAGNOSIS — O321XX0 Maternal care for breech presentation, not applicable or unspecified: Secondary | ICD-10-CM | POA: Diagnosis present

## 2023-08-06 LAB — CBC
HCT: 33.7 % — ABNORMAL LOW (ref 36.0–46.0)
Hemoglobin: 11.7 g/dL — ABNORMAL LOW (ref 12.0–15.0)
MCH: 28.2 pg (ref 26.0–34.0)
MCHC: 34.7 g/dL (ref 30.0–36.0)
MCV: 81.2 fL (ref 80.0–100.0)
Platelets: 258 10*3/uL (ref 150–400)
RBC: 4.15 MIL/uL (ref 3.87–5.11)
RDW: 14.2 % (ref 11.5–15.5)
WBC: 13.4 10*3/uL — ABNORMAL HIGH (ref 4.0–10.5)
nRBC: 0 % (ref 0.0–0.2)

## 2023-08-06 LAB — RPR: RPR Ser Ql: NONREACTIVE

## 2023-08-06 MED ORDER — SODIUM CHLORIDE 0.9 % IV SOLN: INTRAVENOUS | Status: AC | PRN

## 2023-08-06 MED ORDER — DEXTROSE 5 % IV SOLN
250.0000 mg | Freq: Every day | INTRAVENOUS | Status: DC
  Administered 2023-08-07: 250 mg via INTRAVENOUS
  Filled 2023-08-06 (×2): qty 2.5

## 2023-08-06 MED ORDER — AMOXICILLIN 250 MG PO CAPS
250.0000 mg | ORAL_CAPSULE | Freq: Three times a day (TID) | ORAL | Status: DC
  Administered 2023-08-08 (×3): 250 mg via ORAL
  Filled 2023-08-06 (×6): qty 1

## 2023-08-06 MED ORDER — NIFEDIPINE 10 MG PO CAPS
20.0000 mg | ORAL_CAPSULE | Freq: Four times a day (QID) | ORAL | Status: DC
  Administered 2023-08-06: 20 mg via ORAL
  Filled 2023-08-06: qty 2

## 2023-08-06 NOTE — Progress Notes (Signed)
Antepartum Progress Note    Lauren Stephens is a 32 y.o. G3P2002 at [redacted]w[redacted]d admitted for PPROM.   Subjective:  No concerns. Resting comfortably on left side of bed. Not feeling any contractions and denies vaginal bleeding.   Objective:   Vitals:   08/06/23 0245 08/06/23 0630 08/06/23 0718 08/06/23 0850  BP: 120/85 (!) 103/52 (!) 109/54 108/62  Pulse:  91 85 92  Resp:  18 16   Temp:  97.9 F (36.6 C) 97.8 F (36.6 C)   TempSrc:  Oral Oral   Weight:      Height:        Current Vital Signs 24h Vital Sign Ranges  T 97.8 F (36.6 C) Temp  Avg: 98.2 F (36.8 C)  Min: 97.8 F (36.6 C)  Max: 98.9 F (37.2 C)  BP 108/62 BP  Min: 103/52  Max: 121/62  HR 92 Pulse  Avg: 90.5  Min: 83  Max: 102  RR 16 Resp  Avg: 16.8  Min: 16  Max: 18  SaO2     No data recorded      Gen: alert, cooperative, no distress FHR: Baseline: 125 bpm, Variability: moderate, Accels: Present, Decels: none Toco: none SVE: Deferred Abdomen: nontender and no pain elicited to palpation  Membranes: SROM approximately around 1230 on 12/14, clear fluid   Medications SCHEDULED MEDICATIONS   betamethasone acetate-betamethasone sodium phosphate  12 mg Intramuscular Q24H   NIFEdipine  20 mg Oral Q6H    MEDICATION INFUSIONS   ampicillin (OMNIPEN) IV 2 g (08/06/23 1610)   azithromycin 500 mg (08/05/23 1658)   lactated ringers 125 mL/hr at 08/06/23 1007   lactated ringers Stopped (08/06/23 0600)    PRN MEDICATIONS  acetaminophen, terbutaline   Assessment & Plan:  32 y.o. [id number] at [redacted]w[redacted]d admitted for PPROM.  - PPROM             - None to occasional contractions observed on FHR tracing. Per patient,she                is not feeling any contractions.              - Abdomen nontender and no pain elicited to palpation. No vaginal bleeding.               - Continue maintenance tocolytic therapy Procardia 20 mg PO q6 hours until                 2nd dose of BMZ given. No mag for neuro protection >32 wks.              - S/p 1st dose of BMZ (12/14 @ 1700), 2nd dose due 1700 on 12/15             - Latency antibiotics infusing (ampicillin 2g IV q6hrs x 48 and azithro 500 mg     IV q24 hrs)             - Cervical exams to be performed as appropriate   - Afebrile, no evidence of maternal infection   - Fetal Well-being  - NST reactive, Category I  - Continuous fetal monitoring for 24 hours  - BPP (12/14) was 4/8 for no breathing and low fluid, as expected with    PPROM --> repeat BPP not indicated  - Labs as follows:              - WBC: 10.3--> 13.4 (08/06/2023)  - Plan to deliver via c-section @  34 weeks d/t breech presentation. Discussed with    patient. All questions answered. Patient verbalized understanding.  - NICU Consult complete - GBS: negative - GC/C: negative  - HIV: negative - Pain control: 1,000 mg Tylenol q 6hrs prn  - Plan of care reviewed with T. J.Schermerhorn,MD    Ilena Dieckman, CNM  08/06/2023 10:25 AM  [facility]

## 2023-08-06 NOTE — Progress Notes (Signed)
Transferred to Room 347. Alert and oriented with pleasant affect. Color good, skin w&d. States positive Fetal movement, Denies CTX's ( Abdomen soft to Palpation), and states intermittent leaking of clear., sl. Yellow amniotic fluid that is without odor. I checked her Peri pad and it is dry at this time. Instructed Pt. To notify RN if she has decreased fetal movement, CTX, Vaginal bleeding and/or increased vaginal leakage with or without odor. Pt. V/oO. Oriented to Room, Safety and Security and  POC. Pt. V/O.

## 2023-08-06 NOTE — Plan of Care (Signed)
  Problem: Education: Goal: Knowledge of disease or condition will improve Outcome: Progressing Goal: Knowledge of the prescribed therapeutic regimen will improve Outcome: Progressing Goal: Individualized Educational Video(s) Outcome: Progressing   Problem: Clinical Measurements: Goal: Complications related to the disease process, condition or treatment will be avoided or minimized Outcome: Progressing   Problem: Health Behavior/Discharge Planning: Goal: Ability to manage health-related needs will improve Outcome: Progressing   Problem: Clinical Measurements: Goal: Ability to maintain clinical measurements within normal limits will improve Outcome: Progressing Goal: Will remain free from infection Outcome: Progressing Goal: Diagnostic test results will improve Outcome: Progressing Goal: Respiratory complications will improve Outcome: Progressing Goal: Cardiovascular complication will be avoided Outcome: Progressing   Problem: Activity: Goal: Risk for activity intolerance will decrease Outcome: Progressing   Problem: Nutrition: Goal: Adequate nutrition will be maintained Outcome: Progressing   Problem: Coping: Goal: Level of anxiety will decrease Outcome: Progressing   Problem: Elimination: Goal: Will not experience complications related to bowel motility Outcome: Progressing Goal: Will not experience complications related to urinary retention Outcome: Progressing   Problem: Pain Management: Goal: General experience of comfort will improve Outcome: Progressing   Problem: Safety: Goal: Ability to remain free from injury will improve Outcome: Progressing   Problem: Skin Integrity: Goal: Risk for impaired skin integrity will decrease Outcome: Progressing

## 2023-08-06 NOTE — Progress Notes (Signed)
CNM Liboon has given me update .  No CTX currently . On latency Abx . Second Betamethasone  today .  Cat 1 Fetal monitoring . Breech presentation noted. Cbc today

## 2023-08-07 MED ORDER — LACTATED RINGERS IV BOLUS
1000.0000 mL | Freq: Once | INTRAVENOUS | Status: AC
  Administered 2023-08-07: 1000 mL via INTRAVENOUS

## 2023-08-07 NOTE — Progress Notes (Signed)
Transferred to L&D via wheel Chair for c/o Contractions.

## 2023-08-07 NOTE — Progress Notes (Signed)
Discussed plan of delivery if labor continues.  She doesn't appear uncomfortable, but reports pain 9/10 with ctx.  More importantly, we have documented cervical change. Will recheck in about 1 hour.   Bedside ultrasound performed by me.  Head on maternal right and not cephalic.  Otherwise, difficult to determine exactly the positioning of the baby. There appears to be the breach occupying the presenting position, but lower fluid makes the exam technically difficult.  I would recommend scanning again prior to taking to OR in case this represents a change in fetal presentation.   Discussed all of the above with the patient and all questions answered.

## 2023-08-07 NOTE — Progress Notes (Signed)
Pt. Has stated she has had 7 Contractions in the past hour.Glory Buff CNM notified. L&D RN to come and picl Pt up for monitoring in L&D.

## 2023-08-07 NOTE — Progress Notes (Signed)
ANTEPARTUM PROGRESS NOTE  Lauren Stephens is a 32 y.o. G3P2002 at [redacted]w[redacted]d who is admitted for PPROM.  Estimated Date of Delivery: 09/26/23  Length of Stay:  1 Days. Admitted 08/05/2023  Subjective: She reports:  -active fetal movement -leakage of fluid -no vaginal bleeding -painful contractions 8/10  Vitals:  BP 119/70 (BP Location: Left Arm)   Pulse 73   Temp 97.8 F (36.6 C) (Oral)   Resp 16   Ht 5\' 1"  (1.549 m)   Wt 99.8 kg   LMP 11/17/2022   SpO2 97%   BMI 41.57 kg/m  Physical Examination: OBGyn Exam  UYQ:[id number]: rule out uterine contractions Baseline FHR: 120 beats/min Variability: moderate Accelerations: present Decelerations: absent Tocometry: 2-5  Interpretation:   RESULTS:  A NST procedure was performed with FHR monitoring and a normal baseline established, appropriate time of 20-40 minutes of evaluation, and accels >2 seen w 15x15 characteristics.  Results show a REACTIVE NST.    Chari Manning, CNM   Results for orders placed or performed during the hospital encounter of 08/05/23 (from the past 48 hours)  Wet prep, genital     Status: None   Collection Time: 08/05/23  2:48 PM   Specimen: Vaginal  Result Value Ref Range   Yeast Wet Prep HPF POC NONE SEEN NONE SEEN   Trich, Wet Prep NONE SEEN NONE SEEN   Clue Cells Wet Prep HPF POC NONE SEEN NONE SEEN   WBC, Wet Prep HPF POC <10 <10   Sperm NONE SEEN     Comment: Performed at [facility], [address], [place], [place] [address]  Chlamydia/NGC rt PCR [facility] only)     Status: None   Collection Time: 08/05/23  2:48 PM   Specimen: Vaginal  Result Value Ref Range   Specimen source GC/Chlam ENDOCERVICAL    Chlamydia Tr NOT DETECTED NOT DETECTED   N gonorrhoeae NOT DETECTED NOT DETECTED    Comment: (NOTE) This CT/NG assay has not been evaluated in patients with a history of  hysterectomy. Performed at [facility], [address] [place], [place] [address]    Rupture of Membrane (ROM) Plus     Status: None   Collection Time: 08/05/23  2:49 PM  Result Value Ref Range   Rom Plus POSITIVE     Comment: Performed at [facility], [address] [place], [place] [address]  Fetal fibronectin     Status: Abnormal   Collection Time: 08/05/23  2:49 PM  Result Value Ref Range   Fetal Fibronectin POSITIVE (A) NEGATIVE    Comment: Performed at [facility], [address] [place], [place] [address]  Urinalysis, Routine w reflex microscopic -Urine, Clean Catch     Status: Abnormal   Collection Time: 08/05/23  2:49 PM  Result Value Ref Range   Color, Urine YELLOW (A) YELLOW   APPearance CLOUDY (A) CLEAR   Specific Gravity, Urine 1.010 1.005 - 1.030   pH 7.0 5.0 - 8.0   Glucose, UA NEGATIVE NEGATIVE mg/dL   Hgb urine dipstick NEGATIVE NEGATIVE   Bilirubin Urine NEGATIVE NEGATIVE   Ketones, ur NEGATIVE NEGATIVE mg/dL   Protein, ur 30 (A) NEGATIVE mg/dL   Nitrite NEGATIVE NEGATIVE   Leukocytes,Ua TRACE (A) NEGATIVE   RBC / HPF 6-10 0 - 5 RBC/hpf   WBC, UA 11-20 0 - 5 WBC/hpf   Bacteria, UA RARE (A) NONE SEEN   Squamous Epithelial / HPF >50 0 - 5 /HPF  Mucus PRESENT     Comment: Performed at [facility], [address] [place], [place] [address]  Group B strep by PCR     Status: None   Collection Time: 08/05/23  3:16 PM   Specimen: Vaginal/Rectal; Genital  Result Value Ref Range   Group B strep by PCR PRESUMPTIVE NEGATIVE PRESUMPTIVE NEGATIVE    Comment: (NOTE) Intrapartum testing with Xpert Xpress GBS assay should be used as an adjunct to other methods available and not used to replace antepartum testing (at 35-[redacted] weeks gestation).  A GBS PRESUMPTIVE NEGATIVE should be interpreted as: Patient may or may not be colonized with GBS. A GBS presumptive negative result does not exclude the possibility of GBS colonization. Providers should consider new risk factors, if applicable, and clinical guidance regarding a  role for intrapartum prophylaxis.  Recommend culture for confirmation of GBS colonization is clinically indicated.  Performed at [facility], [address] [place], [place] [address]   CBC     Status: None   Collection Time: 08/05/23  4:23 PM  Result Value Ref Range   WBC 10.3 4.0 - 10.5 K/uL   RBC 4.98 3.87 - 5.11 MIL/uL   Hemoglobin 13.7 12.0 - 15.0 g/dL   HCT 25.3 66.4 - 40.3 %   MCV 82.1 80.0 - 100.0 fL   MCH 27.5 26.0 - 34.0 pg   MCHC 33.5 30.0 - 36.0 g/dL   RDW 47.4 25.9 - 56.3 %   Platelets 276 150 - 400 K/uL   nRBC 0.0 0.0 - 0.2 %    Comment: Performed at [facility], [address] [place], [place] [address]  RPR     Status: None   Collection Time: 08/05/23  4:23 PM  Result Value Ref Range   RPR Ser Ql NON REACTIVE NON REACTIVE    Comment: Performed at [facility], [address], [place], [place] [address]  Type and screen     Status: None   Collection Time: 08/05/23  4:23 PM  Result Value Ref Range   ABO/RH(D) O POS    Antibody Screen NEG    Sample Expiration      08/08/2023,2359 Performed at [facility], [address] [place], [place] [address]   CBC     Status: Abnormal   Collection Time: 08/06/23  5:30 AM  Result Value Ref Range   WBC 13.4 (H) 4.0 - 10.5 K/uL   RBC 4.15 3.87 - 5.11 MIL/uL   Hemoglobin 11.7 (L) 12.0 - 15.0 g/dL   HCT 66.0 (L) 63.0 - 16.0 %   MCV 81.2 80.0 - 100.0 fL   MCH 28.2 26.0 - 34.0 pg   MCHC 34.7 30.0 - 36.0 g/dL   RDW 10.9 32.3 - 55.7 %   Platelets 258 150 - 400 K/uL   nRBC 0.0 0.0 - 0.2 %    Comment: Performed at [facility], [address] [place], [place] [address]    US FETAL BPP WO NON STRESS Result Date: 08/05/2023 CLINICAL DATA:  Fluid leaking, 33- weeks by LMP. EXAM: LIMITED OBSTETRIC ULTRASOUND AND BIOPHYSICAL PROFILE COMPARISON:  08/02/2023. FINDINGS: There is a singleton intrauterine pregnancy breechpresentation. Placenta position is anteriorwith no previa.  There is adequate amniotic fluid. AFI is 3.1 cm (normal range for 32 weeks 8.6 cm to 24.2 cm). Cervix is longand closed. Anatomic evaluation limited by gestational age. Fetal heart motion 126BPM. Anatomic evaluation limited by gestational age and lack of fluid. BPD: 8.0cm =  32weeks Ultrasound [facility] 09/30/2023 BIOPHYSICAL PROFILE Movement: 2 time: 30 minutes Breathing: 0 Tone:  2 Amniotic Fluid: 0 Total Score:  4/8 IMPRESSION: 1. Single viable intrauterine pregnancy, breech presentation measuring 32 weeks by BPD. 2. Oligohydramnios. 3. Biophysical profile score is 4 out of 8. Electronically Signed   By: Layla Maw M.D.   On: 08/05/2023 19:01   US OB Limited Result Date: 08/05/2023 CLINICAL DATA:  Fluid leaking, 33- weeks by LMP. EXAM: LIMITED OBSTETRIC ULTRASOUND AND BIOPHYSICAL PROFILE COMPARISON:  08/02/2023. FINDINGS: There is a singleton intrauterine pregnancy breechpresentation. Placenta position is anteriorwith no previa. There is adequate amniotic fluid. AFI is 3.1 cm (normal range for 32 weeks 8.6 cm to 24.2 cm). Cervix is longand closed. Anatomic evaluation limited by gestational age. Fetal heart motion 126BPM. Anatomic evaluation limited by gestational age and lack of fluid. BPD: 8.0cm = 32weeks Ultrasound [facility] 09/30/2023 BIOPHYSICAL PROFILE Movement: 2 time: 30 minutes Breathing: 0 Tone:  2 Amniotic Fluid: 0 Total Score:  4/8 IMPRESSION: 1. Single viable intrauterine pregnancy, breech presentation measuring 32 weeks by BPD. 2. Oligohydramnios. 3. Biophysical profile score is 4 out of 8. Electronically Signed   By: Layla Maw M.D.   On: 08/05/2023 19:01    Current scheduled medications  [START ON 08/08/2023] amoxicillin  250 mg Oral Q8H    I have reviewed the patient's current medications.  ASSESSMENT: Patient Active Problem List   Diagnosis Date Noted   Vaginal discharge during pregnancy in third trimester 08/06/2023   Preterm premature rupture of membranes in third trimester  08/05/2023   AFI (amniotic fluid index) borderline low-normal on 08/02/23 u/s (6.89 cm total volume) 08/02/2023   Sciatica 05/23/2023   Rubella non-immune status, antepartum 02/14/2023   UTI (urinary tract infection) during pregnancy Proteus mirabilis 02/07/23 02/14/2023   Supervision of other normal pregnancy, antepartum 02/07/2023   Ovarian cyst during pregnancy 02/07/2023   Maternal obesity without hypertension 02/07/2023   Morbid obesity (HCC) BMI=42.3 08/01/2019   Insulin resistance 02/05/2018    PLAN: Continue routine antenatal care. Continue routine antenatal care. - Plan to deliver via c-section @ 34 weeks d/t breech presentation. Discussed with    patient. All questions answered. Patient verbalized understanding.  - NICU Consult complete Continue maintenance tocolytic therapy Procardia 20 mg PO q6 hours until                 2nd dose of BMZ given. No mag for neuro protection >32 wks.             - S/p 1st dose of BMZ (12/14 @ 1700), 2nd dose due 1700 on 12/15             - Latency antibiotics infusing (ampicillin 2g IV q6hrs x 48 and azithro 500 mg     IV q24 hrs)             - Cervical exams to be performed as appropriate              - Afebrile, no evidence of maternal infection - Pain control: 1,000 mg Tylenol q 6hrs prn  -recheck cervix in 2 hours  Dr Jean Rosenthal notified and updated  Chari Manning, CNM 08/07/2023 12:54 PM  Chari Manning Certified Nurse Midwife [facility]

## 2023-08-07 NOTE — Progress Notes (Signed)
ANTEPARTUM PROGRESS NOTE  Lauren Stephens is a 32 y.o. G3P2002 at [redacted]w[redacted]d who is admitted for Preterm labor, PROM.  Estimated Date of Delivery: 09/26/23  Length of Stay:  1 Days. Admitted 08/05/2023  Subjective: She reports:  -active fetal movement -small amount of leaking of fluid -no vaginal bleeding -contractions Reports contractions are less intense since the IV fluid bolus and is inquiring about eating. Vitals:  BP 119/70 (BP Location: Left Arm)   Pulse 73   Temp 97.8 F (36.6 C) (Oral)   Resp 16   Ht 5\' 1"  (1.549 m)   Wt 99.8 kg   LMP 11/17/2022   SpO2 97%   BMI 41.57 kg/m  Physical Examination: OBGyn Exam  WUJ:WJXBJYNWGNF: rule out uterine contractions Baseline FHR: 120 beats/min Variability: moderate Accelerations: present Decelerations: present, mild variables, appropriate for gestational age Tocometry: occasional  Interpretation:   RESULTS:  A NST procedure was performed with FHR monitoring and a normal baseline established, appropriate time of 20-40 minutes of evaluation, and accels >2 seen w 15x15 characteristics.  Results show a REACTIVE NST.    Chari Manning, CNM   Results for orders placed or performed during the hospital encounter of 08/05/23 (from the past 48 hours)  Wet prep, genital     Status: None   Collection Time: 08/05/23  2:48 PM   Specimen: Vaginal  Result Value Ref Range   Yeast Wet Prep HPF POC NONE SEEN NONE SEEN   Trich, Wet Prep NONE SEEN NONE SEEN   Clue Cells Wet Prep HPF POC NONE SEEN NONE SEEN   WBC, Wet Prep HPF POC <10 <10   Sperm NONE SEEN     Comment: Performed at [facility], [address] [place], [place] [address]  Chlamydia/NGC rt PCR [facility] only)     Status: None   Collection Time: 08/05/23  2:48 PM   Specimen: Vaginal  Result Value Ref Range   Specimen source GC/Chlam ENDOCERVICAL    Chlamydia Tr NOT DETECTED NOT DETECTED   N gonorrhoeae NOT DETECTED NOT DETECTED    Comment: (NOTE) This  CT/NG assay has not been evaluated in patients with a history of  hysterectomy. Performed at [facility], [address] [place], [place] [address]   Rupture of Membrane (ROM) Plus     Status: None   Collection Time: 08/05/23  2:49 PM  Result Value Ref Range   Rom Plus POSITIVE     Comment: Performed at [facility], [address] [place], [place] [address]  Fetal fibronectin     Status: Abnormal   Collection Time: 08/05/23  2:49 PM  Result Value Ref Range   Fetal Fibronectin POSITIVE (A) NEGATIVE    Comment: Performed at [facility], [address] [place], [place] [address]  Urinalysis, Routine w reflex microscopic -Urine, Clean Catch     Status: Abnormal   Collection Time: 08/05/23  2:49 PM  Result Value Ref Range   Color, Urine YELLOW (A) YELLOW   APPearance CLOUDY (A) CLEAR   Specific Gravity, Urine 1.010 1.005 - 1.030   pH 7.0 5.0 - 8.0   Glucose, UA NEGATIVE NEGATIVE mg/dL   Hgb urine dipstick NEGATIVE NEGATIVE   Bilirubin Urine NEGATIVE NEGATIVE   Ketones, ur NEGATIVE NEGATIVE mg/dL   Protein, ur 30 (A) NEGATIVE mg/dL   Nitrite NEGATIVE NEGATIVE   Leukocytes,Ua TRACE (A) NEGATIVE   RBC / HPF 6-10 0 - 5 RBC/hpf   WBC, UA 11-20 0 -  5 WBC/hpf   Bacteria, UA RARE (A) NONE SEEN   Squamous Epithelial / HPF >50 0 - 5 /HPF   Mucus PRESENT     Comment: Performed at [facility], [address] [place], [place] [address]  Group B strep by PCR     Status: None   Collection Time: 08/05/23  3:16 PM   Specimen: Vaginal/Rectal; Genital  Result Value Ref Range   Group B strep by PCR PRESUMPTIVE NEGATIVE PRESUMPTIVE NEGATIVE    Comment: (NOTE) Intrapartum testing with Xpert Xpress GBS assay should be used as an adjunct to other methods available and not used to replace antepartum testing (at 35-[redacted] weeks gestation).  A GBS PRESUMPTIVE NEGATIVE should be interpreted as: Patient may or may not be colonized with GBS. A GBS presumptive  negative result does not exclude the possibility of GBS colonization. Providers should consider new risk factors, if applicable, and clinical guidance regarding a role for intrapartum prophylaxis.  Recommend culture for confirmation of GBS colonization is clinically indicated.  Performed at [facility], [address] [place], [place] [address]   CBC     Status: None   Collection Time: 08/05/23  4:23 PM  Result Value Ref Range   WBC 10.3 4.0 - 10.5 K/uL   RBC 4.98 3.87 - 5.11 MIL/uL   Hemoglobin 13.7 12.0 - 15.0 g/dL   HCT 02.7 25.3 - 66.4 %   MCV 82.1 80.0 - 100.0 fL   MCH 27.5 26.0 - 34.0 pg   MCHC 33.5 30.0 - 36.0 g/dL   RDW 40.3 47.4 - 25.9 %   Platelets 276 150 - 400 K/uL   nRBC 0.0 0.0 - 0.2 %    Comment: Performed at [facility], [address] [place], [place] [address]  RPR     Status: None   Collection Time: 08/05/23  4:23 PM  Result Value Ref Range   RPR Ser Ql NON REACTIVE NON REACTIVE    Comment: Performed at [facility], [address], [place], [place] [address]  Type and screen     Status: None   Collection Time: 08/05/23  4:23 PM  Result Value Ref Range   ABO/RH(D) O POS    Antibody Screen NEG    Sample Expiration      08/08/2023,2359 Performed at [facility], [address] [place], [place] [address]   CBC     Status: Abnormal   Collection Time: 08/06/23  5:30 AM  Result Value Ref Range   WBC 13.4 (H) 4.0 - 10.5 K/uL   RBC 4.15 3.87 - 5.11 MIL/uL   Hemoglobin 11.7 (L) 12.0 - 15.0 g/dL   HCT 84.1 (L) 66.0 - 63.0 %   MCV 81.2 80.0 - 100.0 fL   MCH 28.2 26.0 - 34.0 pg   MCHC 34.7 30.0 - 36.0 g/dL   RDW 16.0 10.9 - 32.3 %   Platelets 258 150 - 400 K/uL   nRBC 0.0 0.0 - 0.2 %    Comment: Performed at [facility], [address] [place], [place] [address]    US FETAL BPP WO NON STRESS Result Date: 08/05/2023 CLINICAL DATA:  Fluid leaking, 33- weeks by LMP. EXAM: LIMITED OBSTETRIC ULTRASOUND AND  BIOPHYSICAL PROFILE COMPARISON:  08/02/2023. FINDINGS: There is a singleton intrauterine pregnancy breechpresentation. Placenta position is anteriorwith no previa. There is adequate amniotic fluid. AFI is 3.1 cm (normal range for 32 weeks 8.6 cm to 24.2 cm). Cervix is longand  closed. Anatomic evaluation limited by gestational age. Fetal heart motion 126BPM. Anatomic evaluation limited by gestational age and lack of fluid. BPD: 8.0cm = 32weeks Ultrasound [facility] 09/30/2023 BIOPHYSICAL PROFILE Movement: 2 time: 30 minutes Breathing: 0 Tone:  2 Amniotic Fluid: 0 Total Score:  4/8 IMPRESSION: 1. Single viable intrauterine pregnancy, breech presentation measuring 32 weeks by BPD. 2. Oligohydramnios. 3. Biophysical profile score is 4 out of 8. Electronically Signed   By: Layla Maw M.D.   On: 08/05/2023 19:01   US OB Limited Result Date: 08/05/2023 CLINICAL DATA:  Fluid leaking, 33- weeks by LMP. EXAM: LIMITED OBSTETRIC ULTRASOUND AND BIOPHYSICAL PROFILE COMPARISON:  08/02/2023. FINDINGS: There is a singleton intrauterine pregnancy breechpresentation. Placenta position is anteriorwith no previa. There is adequate amniotic fluid. AFI is 3.1 cm (normal range for 32 weeks 8.6 cm to 24.2 cm). Cervix is longand closed. Anatomic evaluation limited by gestational age. Fetal heart motion 126BPM. Anatomic evaluation limited by gestational age and lack of fluid. BPD: 8.0cm = 32weeks Ultrasound [facility] 09/30/2023 BIOPHYSICAL PROFILE Movement: 2 time: 30 minutes Breathing: 0 Tone:  2 Amniotic Fluid: 0 Total Score:  4/8 IMPRESSION: 1. Single viable intrauterine pregnancy, breech presentation measuring 32 weeks by BPD. 2. Oligohydramnios. 3. Biophysical profile score is 4 out of 8. Electronically Signed   By: Layla Maw M.D.   On: 08/05/2023 19:01    Current scheduled medications  [START ON 08/08/2023] amoxicillin  250 mg Oral Q8H    I have reviewed the patient's current medications.  ASSESSMENT: Patient Active  Problem List   Diagnosis Date Noted   Vaginal discharge during pregnancy in third trimester 08/06/2023   Preterm premature rupture of membranes in third trimester 08/05/2023   AFI (amniotic fluid index) borderline low-normal on 08/02/23 u/s (6.89 cm total volume) 08/02/2023   Sciatica 05/23/2023   Rubella non-immune status, antepartum 02/14/2023   UTI (urinary tract infection) during pregnancy Proteus mirabilis 02/07/23 02/14/2023   Supervision of other normal pregnancy, antepartum 02/07/2023   Ovarian cyst during pregnancy 02/07/2023   Maternal obesity without hypertension 02/07/2023   Morbid obesity (HCC) BMI=42.3 08/01/2019   Insulin resistance 02/05/2018    PLAN: Continue routine antenatal care. - Plan to deliver via c-section @ 34 weeks d/t breech presentation. Discussed with    patient. All questions answered. Patient verbalized understanding.  - NICU Consult complete Continue maintenance tocolytic therapy Procardia 20 mg PO q6 hours until                 2nd dose of BMZ given. No mag for neuro protection >32 wks.             - S/p 1st dose of BMZ (12/14 @ 1700), 2nd dose due 1700 on 12/15             - Latency antibiotics infusing (ampicillin 2g IV q6hrs x 48 and azithro 500 mg     IV q24 hrs)             - Cervical exams to be performed as appropriate              - Afebrile, no evidence of maternal infection - Pain control: 1,000 mg Tylenol q 6hrs prn  -recheck cervix in 2 hours  Chari Manning Certified Nurse Midwife [facility]

## 2023-08-08 ENCOUNTER — Inpatient Hospital Stay: Payer: Medicaid Other

## 2023-08-08 MED ORDER — AZITHROMYCIN 250 MG PO TABS
250.0000 mg | ORAL_TABLET | Freq: Every day | ORAL | Status: DC
  Administered 2023-08-08: 250 mg via ORAL
  Filled 2023-08-08 (×2): qty 1

## 2023-08-08 MED ORDER — COMPLETENATE 29-1 MG PO CHEW: 1.0000 | CHEWABLE_TABLET | Freq: Every day | ORAL | Status: DC

## 2023-08-08 MED ORDER — PRENATAL MULTIVITAMIN CH
1.0000 | ORAL_TABLET | Freq: Every day | ORAL | Status: DC
  Administered 2023-08-08 – 2023-08-12 (×5): 1 via ORAL
  Filled 2023-08-08 (×6): qty 1

## 2023-08-08 NOTE — Progress Notes (Signed)
Patient transferred from antepartum to L&D due to reports of contractions and leaking of fluid. NST completed and one contraction noted. Discussed findings with patient and educated patient on plan of care and expectations with PPROM. Patient verbalized understanding and was transferred via wheelchair back to antepartum.

## 2023-08-08 NOTE — Progress Notes (Signed)
Patient wheeled to labor and delivery in OBS2 for monitoring after having contractions 2-3 minutes apart, pain 8/10, and LOF.

## 2023-08-08 NOTE — Progress Notes (Signed)
ANTEPARTUM PROGRESS NOTE  Lauren Stephens is a 32 y.o. G3P2002 at [redacted]w[redacted]d who is admitted for rupture of membranes.   Estimated Date of Delivery: 09/26/23  Length of Stay:  2 Days. Admitted 08/05/2023  Subjective: Reports only occasional contractions that are not painful. Leaking has decreased, denies vaginal bleeding. Endorses good fetal movement.   Vitals:  BP 104/64 (BP Location: Left Arm)   Pulse 70   Temp 98.7 F (37.1 C) (Oral)   Resp 20   Ht 5\' 1"  (1.549 m)   Wt 99.8 kg   LMP 11/17/2022   SpO2 100%   BMI 41.57 kg/m  Physical Examination: General:   alert, cooperative, and no distress  Skin:  normal  Neurologic:    Alert & oriented x 3  Lungs:   Normal respiratory effort   Abdomen:   Gravid, non-tender   Pelvis:  Exam deferred.  Presentations: breech as of 08/05/2023 - confirmed again on 08/07/2023  Extremities: : non-tender, symmetric, no edema bilaterally.  DTRs: 2+/2+    NST: completed 12/17 at 0345 Baseline FHR: 120 beats/min Variability: moderate Accelerations: present Decelerations: absent Tocometry: Occasional, mild contraction  Interpretation:  INDICATIONS: pPROM RESULTS:  A NST procedure was performed with FHR monitoring and a normal baseline established, appropriate time of 20-40 minutes of evaluation, and accels >2 seen w 15x15 characteristics.  Results show a REACTIVE NST.   CBC    Component Value Date/Time   WBC 13.4 (H) 08/06/2023 0530   RBC 4.15 08/06/2023 0530   HGB 11.7 (L) 08/06/2023 0530   HGB 14.0 02/07/2023 1022   HCT 33.7 (L) 08/06/2023 0530   HCT 43.6 02/07/2023 1022   PLT 258 08/06/2023 0530   PLT 272 02/07/2023 1022   MCV 81.2 08/06/2023 0530   MCV 85 02/07/2023 1022   MCV 80 02/24/2014 1547   MCH 28.2 08/06/2023 0530   MCHC 34.7 08/06/2023 0530   RDW 14.2 08/06/2023 0530   RDW 15.0 02/07/2023 1022   RDW 16.3 (H) 02/24/2014 1547   LYMPHSABS 2.8 02/07/2023 1022   LYMPHSABS 2.6 12/21/2011 0751   MONOABS 1.2 (H)  06/24/2022 0048   MONOABS 0.5 12/21/2011 0751   EOSABS 0.1 02/07/2023 1022   EOSABS 0.1 12/21/2011 0751   BASOSABS 0.1 02/07/2023 1022   BASOSABS 0.1 12/21/2011 0751   No results found for this or any previous visit (from the past 48 hours).  No results found.  Current scheduled medications  amoxicillin  250 mg Oral Q8H   azithromycin  250 mg Oral Daily   prenatal multivitamin  1 tablet Oral Q1200    I have reviewed the patient's current medications.  ASSESSMENT: Patient Active Problem List   Diagnosis Date Noted   Vaginal discharge during pregnancy in third trimester 08/06/2023   Preterm premature rupture of membranes in third trimester 08/05/2023   AFI (amniotic fluid index) borderline low-normal on 08/02/23 u/s (6.89 cm total volume) 08/02/2023   Sciatica 05/23/2023   Rubella non-immune status, antepartum 02/14/2023   UTI (urinary tract infection) during pregnancy Proteus mirabilis 02/07/23 02/14/2023   Supervision of other normal pregnancy, antepartum 02/07/2023   Ovarian cyst during pregnancy 02/07/2023   Maternal obesity without hypertension 02/07/2023   Morbid obesity (HCC) BMI=42.3 08/01/2019   Insulin resistance 02/05/2018    PLAN:  Inpatient Antepartum:  -Continue routine antenatal care -SCD's while resting in bed -Activity as tolerated  -Bathroom privileges  -Regular diet  -Daily NST  -BMZ completed - received 2 doses on 12/14 and  12/15  pPROM: -BPP ordered for today  -IV latency abx completed - started on PO amoxicillin 250 mg and PO azithromycin 250 mg for 5 days  -Remains afebrile  -CBC q 72 hours  -Discussed delivery via c/section at 34 weeks d/t breech presentation. -Briefly reviewed risk of infection after 34 weeks versus continued antepartum management to 35-36 weeks to decrease risk and/or length of NICU admission -At this time she is GBS negative and negative for vaginal infections. WBC have been stable and she remains afebrile. Can consider  delaying delivery if patient desires to. Reviewed that current guidelines support delivery at 34 weeks d/t risk of infection from prolong rupture but decision to delay would be based on ongoing discussion between [place] and OB team.  -recommend proceeding with delivery despite gestational age for any concerns of infection, worsening maternal/fetal status, or non-reassuring fetal testing  -At this time, Lauren Stephens is ok with planned delivery at 12 weeks   Breech presentation -Noted breech presentation on [place] 08/05/2023 -Dr. Jean Rosenthal performed bedside US on 08/07/2023 and fetal head was to maternal right, not cephalic -Plan for  [place] to confirm presentation if she progress in labor or prior to scheduled delivery to determine route of delivery   Margaretmary Eddy, CNM Certified Nurse Midwife [facility]

## 2023-08-08 NOTE — Progress Notes (Signed)
Patient back to room. Vitals to be taken once patient back in bed from bathroom.

## 2023-08-09 ENCOUNTER — Inpatient Hospital Stay: Payer: Medicaid Other | Admitting: Anesthesiology

## 2023-08-09 ENCOUNTER — Other Ambulatory Visit: Payer: Self-pay

## 2023-08-09 ENCOUNTER — Encounter: Admission: EM | Disposition: A | Discharge status: Home / Self Care | Payer: Self-pay | Source: Home / Self Care

## 2023-08-09 ENCOUNTER — Ambulatory Visit: Payer: Medicaid Other

## 2023-08-09 ENCOUNTER — Encounter: Payer: Self-pay | Admitting: Obstetrics and Gynecology

## 2023-08-09 DIAGNOSIS — Z3483 Encounter for supervision of other normal pregnancy, third trimester: Secondary | ICD-10-CM | POA: Diagnosis not present

## 2023-08-09 LAB — CBC
HCT: 40.2 % (ref 36.0–46.0)
HCT: 37.1 % (ref 36.0–46.0)
Hemoglobin: 13.5 g/dL (ref 12.0–15.0)
Hemoglobin: 13 g/dL (ref 12.0–15.0)
MCH: 27.8 pg (ref 26.0–34.0)
MCH: 28.3 pg (ref 26.0–34.0)
MCHC: 33.6 g/dL (ref 30.0–36.0)
MCHC: 35 g/dL (ref 30.0–36.0)
MCV: 82.9 fL (ref 80.0–100.0)
MCV: 80.8 fL (ref 80.0–100.0)
Platelets: 261 10*3/uL (ref 150–400)
Platelets: 265 10*3/uL (ref 150–400)
RBC: 4.85 MIL/uL (ref 3.87–5.11)
RBC: 4.59 MIL/uL (ref 3.87–5.11)
RDW: 14.9 % (ref 11.5–15.5)
RDW: 14.6 % (ref 11.5–15.5)
WBC: 12 10*3/uL — ABNORMAL HIGH (ref 4.0–10.5)
WBC: 23 10*3/uL — ABNORMAL HIGH (ref 4.0–10.5)
nRBC: 0.2 % (ref 0.0–0.2)
nRBC: 0 % (ref 0.0–0.2)

## 2023-08-09 LAB — TYPE AND SCREEN
ABO/RH(D): O POS
Antibody Screen: NEGATIVE

## 2023-08-09 SURGERY — Surgical Case
Anesthesia: General

## 2023-08-09 MED ORDER — MORPHINE SULFATE (PF) 0.5 MG/ML IJ SOLN
INTRAMUSCULAR | Status: DC | PRN
  Administered 2023-08-09 (×2): 2 mg via INTRAVENOUS
  Administered 2023-08-09: 1 mg via INTRAVENOUS

## 2023-08-09 MED ORDER — ONDANSETRON HCL 4 MG/2ML IJ SOLN
INTRAMUSCULAR | Status: AC
  Filled 2023-08-09: qty 2

## 2023-08-09 MED ORDER — SODIUM CHLORIDE 0.9 % IV SOLN
INTRAVENOUS | Status: AC
  Filled 2023-08-09: qty 5

## 2023-08-09 MED ORDER — PRENATAL MULTIVITAMIN CH: 1.0000 | ORAL_TABLET | Freq: Every day | ORAL | Status: DC

## 2023-08-09 MED ORDER — OXYTOCIN-SODIUM CHLORIDE 30-0.9 UT/500ML-% IV SOLN
INTRAVENOUS | Status: DC | PRN
  Administered 2023-08-09: 30 [IU] via INTRAVENOUS

## 2023-08-09 MED ORDER — WITCH HAZEL-GLYCERIN EX PADS: 1.0000 | MEDICATED_PAD | CUTANEOUS | Status: DC | PRN

## 2023-08-09 MED ORDER — OXYCODONE HCL 5 MG PO TABS: 5.0000 mg | ORAL_TABLET | Freq: Once | ORAL | Status: DC | PRN

## 2023-08-09 MED ORDER — CEFAZOLIN SODIUM-DEXTROSE 2-4 GM/100ML-% IV SOLN
INTRAVENOUS | Status: AC
  Filled 2023-08-09: qty 100

## 2023-08-09 MED ORDER — ONDANSETRON HCL 4 MG/2ML IJ SOLN
INTRAMUSCULAR | Status: DC | PRN
  Administered 2023-08-09: 4 mg via INTRAVENOUS

## 2023-08-09 MED ORDER — 0.9 % SODIUM CHLORIDE (POUR BTL) OPTIME
TOPICAL | Status: DC | PRN
  Administered 2023-08-09: 1000 mL

## 2023-08-09 MED ORDER — MORPHINE SULFATE (PF) 2 MG/ML IV SOLN
1.0000 mg | INTRAVENOUS | Status: DC | PRN
  Administered 2023-08-09: 2 mg via INTRAVENOUS
  Filled 2023-08-09: qty 1

## 2023-08-09 MED ORDER — SUCCINYLCHOLINE CHLORIDE 200 MG/10ML IV SOSY
PREFILLED_SYRINGE | INTRAVENOUS | Status: DC | PRN
  Administered 2023-08-09: 120 mg via INTRAVENOUS

## 2023-08-09 MED ORDER — DIPHENHYDRAMINE HCL 25 MG PO CAPS: 25.0000 mg | ORAL_CAPSULE | Freq: Four times a day (QID) | ORAL | Status: DC | PRN

## 2023-08-09 MED ORDER — IBUPROFEN 600 MG PO TABS: 600.0000 mg | ORAL_TABLET | Freq: Four times a day (QID) | ORAL | Status: DC

## 2023-08-09 MED ORDER — PROPOFOL 10 MG/ML IV BOLUS
INTRAVENOUS | Status: DC | PRN
  Administered 2023-08-09: 150 mg via INTRAVENOUS

## 2023-08-09 MED ORDER — FENTANYL CITRATE (PF) 100 MCG/2ML IJ SOLN
INTRAMUSCULAR | Status: DC | PRN
  Administered 2023-08-09: 50 ug via INTRAVENOUS
  Administered 2023-08-09: 35 ug via INTRAVENOUS

## 2023-08-09 MED ORDER — SODIUM CHLORIDE 0.9% FLUSH: 50.0000 mL | Freq: Once | INTRAVENOUS | Status: DC

## 2023-08-09 MED ORDER — METHYLERGONOVINE MALEATE 0.2 MG/ML IJ SOLN
INTRAMUSCULAR | Status: DC | PRN
  Administered 2023-08-09: .2 mg via INTRAMUSCULAR

## 2023-08-09 MED ORDER — DIBUCAINE (PERIANAL) 1 % EX OINT: 1.0000 | TOPICAL_OINTMENT | CUTANEOUS | Status: DC | PRN

## 2023-08-09 MED ORDER — OXYCODONE HCL 5 MG/5ML PO SOLN
5.0000 mg | Freq: Once | ORAL | Status: DC | PRN
  Filled 2023-08-09: qty 5

## 2023-08-09 MED ORDER — SOD CITRATE-CITRIC ACID 500-334 MG/5ML PO SOLN: 30.0000 mL | ORAL | Status: AC

## 2023-08-09 MED ORDER — OXYTOCIN-SODIUM CHLORIDE 30-0.9 UT/500ML-% IV SOLN
INTRAVENOUS | Status: AC
  Filled 2023-08-09: qty 500

## 2023-08-09 MED ORDER — KETOROLAC TROMETHAMINE 30 MG/ML IJ SOLN
30.0000 mg | Freq: Four times a day (QID) | INTRAMUSCULAR | Status: AC
  Administered 2023-08-09 (×3): 30 mg via INTRAVENOUS
  Filled 2023-08-09 (×3): qty 1

## 2023-08-09 MED ORDER — COCONUT OIL OIL
1.0000 | TOPICAL_OIL | Status: DC | PRN
  Filled 2023-08-09: qty 7.5

## 2023-08-09 MED ORDER — FENTANYL CITRATE (PF) 100 MCG/2ML IJ SOLN
25.0000 ug | INTRAMUSCULAR | Status: DC | PRN
  Administered 2023-08-09: 50 ug via INTRAVENOUS
  Filled 2023-08-09: qty 2

## 2023-08-09 MED ORDER — SIMETHICONE 80 MG PO CHEW
80.0000 mg | CHEWABLE_TABLET | ORAL | Status: DC | PRN
  Administered 2023-08-09 – 2023-08-10 (×2): 80 mg via ORAL
  Filled 2023-08-09: qty 1

## 2023-08-09 MED ORDER — KETOROLAC TROMETHAMINE 30 MG/ML IJ SOLN: 30.0000 mg | Freq: Four times a day (QID) | INTRAMUSCULAR | Status: DC

## 2023-08-09 MED ORDER — SIMETHICONE 80 MG PO CHEW
80.0000 mg | CHEWABLE_TABLET | Freq: Three times a day (TID) | ORAL | Status: DC
  Administered 2023-08-09 – 2023-08-12 (×10): 80 mg via ORAL
  Filled 2023-08-09 (×11): qty 1

## 2023-08-09 MED ORDER — SENNOSIDES-DOCUSATE SODIUM 8.6-50 MG PO TABS
2.0000 | ORAL_TABLET | Freq: Every day | ORAL | Status: DC
  Administered 2023-08-10 – 2023-08-12 (×3): 2 via ORAL
  Filled 2023-08-09 (×3): qty 2

## 2023-08-09 MED ORDER — SODIUM CHLORIDE 0.9 % IV SOLN
500.0000 mg | INTRAVENOUS | Status: AC
  Administered 2023-08-09: 500 mg via INTRAVENOUS

## 2023-08-09 MED ORDER — FENTANYL CITRATE (PF) 100 MCG/2ML IJ SOLN
INTRAMUSCULAR | Status: AC
  Filled 2023-08-09: qty 2

## 2023-08-09 MED ORDER — PHENYLEPHRINE HCL-NACL 20-0.9 MG/250ML-% IV SOLN
INTRAVENOUS | Status: AC
  Filled 2023-08-09: qty 250

## 2023-08-09 MED ORDER — DEXAMETHASONE SODIUM PHOSPHATE 10 MG/ML IJ SOLN
INTRAMUSCULAR | Status: DC | PRN
  Administered 2023-08-09: 5 mg via INTRAVENOUS

## 2023-08-09 MED ORDER — ACETAMINOPHEN 500 MG PO TABS
1000.0000 mg | ORAL_TABLET | Freq: Four times a day (QID) | ORAL | Status: DC
  Administered 2023-08-09 – 2023-08-10 (×4): 1000 mg via ORAL
  Filled 2023-08-09 (×4): qty 2

## 2023-08-09 MED ORDER — SOD CITRATE-CITRIC ACID 500-334 MG/5ML PO SOLN
ORAL | Status: AC
  Administered 2023-08-09: 30 mL via ORAL
  Filled 2023-08-09: qty 15

## 2023-08-09 MED ORDER — DEXAMETHASONE SODIUM PHOSPHATE 10 MG/ML IJ SOLN
INTRAMUSCULAR | Status: AC
Start: 2023-08-09 — End: ?
  Filled 2023-08-09: qty 1

## 2023-08-09 MED ORDER — KETOROLAC TROMETHAMINE 30 MG/ML IJ SOLN
INTRAMUSCULAR | Status: DC | PRN
  Administered 2023-08-09: 30 mg via INTRAVENOUS

## 2023-08-09 MED ORDER — OXYTOCIN-SODIUM CHLORIDE 30-0.9 UT/500ML-% IV SOLN
2.5000 [IU]/h | INTRAVENOUS | Status: AC
  Administered 2023-08-09: 2.5 [IU]/h via INTRAVENOUS

## 2023-08-09 MED ORDER — SODIUM CHLORIDE 0.9 % IV SOLN: INTRAVENOUS | Status: DC | PRN

## 2023-08-09 MED ORDER — CHLORHEXIDINE GLUCONATE 0.12 % MT SOLN
OROMUCOSAL | Status: AC
  Administered 2023-08-09: 15 mL
  Filled 2023-08-09: qty 15

## 2023-08-09 MED ORDER — BUPIVACAINE HCL (PF) 0.5 % IJ SOLN
INTRAMUSCULAR | Status: AC
  Filled 2023-08-09: qty 20

## 2023-08-09 MED ORDER — KETOROLAC TROMETHAMINE 30 MG/ML IJ SOLN
INTRAMUSCULAR | Status: AC
  Filled 2023-08-09: qty 1

## 2023-08-09 MED ORDER — MORPHINE SULFATE (PF) 0.5 MG/ML IJ SOLN
INTRAMUSCULAR | Status: AC
  Filled 2023-08-09: qty 10

## 2023-08-09 MED ORDER — GABAPENTIN 300 MG PO CAPS
300.0000 mg | ORAL_CAPSULE | Freq: Every day | ORAL | Status: DC
  Administered 2023-08-09 – 2023-08-11 (×3): 300 mg via ORAL
  Filled 2023-08-09 (×3): qty 1

## 2023-08-09 MED ORDER — MEASLES, MUMPS & RUBELLA VAC IJ SOLR: 0.5000 mL | INTRAMUSCULAR | Status: DC | PRN

## 2023-08-09 MED ORDER — OXYCODONE HCL 5 MG PO TABS
5.0000 mg | ORAL_TABLET | ORAL | Status: DC | PRN
  Administered 2023-08-09: 10 mg via ORAL
  Administered 2023-08-11 (×2): 5 mg via ORAL
  Filled 2023-08-09: qty 2
  Filled 2023-08-09 (×2): qty 1

## 2023-08-09 MED ORDER — BUPIVACAINE HCL (PF) 0.25 % IJ SOLN
INTRAMUSCULAR | Status: AC
  Filled 2023-08-09: qty 60

## 2023-08-09 MED ORDER — METHYLERGONOVINE MALEATE 0.2 MG/ML IJ SOLN
INTRAMUSCULAR | Status: AC
  Filled 2023-08-09: qty 1

## 2023-08-09 MED ORDER — IBUPROFEN 600 MG PO TABS
600.0000 mg | ORAL_TABLET | Freq: Four times a day (QID) | ORAL | Status: DC
  Administered 2023-08-10: 600 mg via ORAL
  Filled 2023-08-09: qty 1

## 2023-08-09 MED ORDER — ENOXAPARIN SODIUM 60 MG/0.6ML IJ SOSY
0.5000 mg/kg | PREFILLED_SYRINGE | INTRAMUSCULAR | Status: DC
  Administered 2023-08-09 – 2023-08-11 (×3): 47.5 mg via SUBCUTANEOUS
  Filled 2023-08-09 (×4): qty 0.6

## 2023-08-09 MED ORDER — MENTHOL 3 MG MT LOZG: 1.0000 | LOZENGE | OROMUCOSAL | Status: DC | PRN

## 2023-08-09 MED ORDER — TRANEXAMIC ACID-NACL 1000-0.7 MG/100ML-% IV SOLN
INTRAVENOUS | Status: AC
  Filled 2023-08-09: qty 100

## 2023-08-09 MED ORDER — ZOLPIDEM TARTRATE 5 MG PO TABS: 5.0000 mg | ORAL_TABLET | Freq: Every evening | ORAL | Status: DC | PRN

## 2023-08-09 MED ORDER — CEFAZOLIN SODIUM-DEXTROSE 1-4 GM/50ML-% IV SOLN
INTRAVENOUS | Status: DC | PRN
  Administered 2023-08-09: 1 g via INTRAVENOUS

## 2023-08-09 MED ORDER — CEFAZOLIN SODIUM-DEXTROSE 2-4 GM/100ML-% IV SOLN
2.0000 g | INTRAVENOUS | Status: AC
  Administered 2023-08-09: 2 g via INTRAVENOUS

## 2023-08-09 SURGICAL SUPPLY — 29 items
BARRIER ADHS 3X4 INTERCEED (GAUZE/BANDAGES/DRESSINGS) ×1 IMPLANT
CHLORAPREP W/TINT 26 (MISCELLANEOUS) ×1 IMPLANT
DRSG TELFA 3X8 NADH STRL (GAUZE/BANDAGES/DRESSINGS) ×1 IMPLANT
ELECT CAUTERY BLADE 6.4 (BLADE) ×1 IMPLANT
ELECT REM PT RETURN 9FT ADLT (ELECTROSURGICAL) ×1
ELECTRODE REM PT RTRN 9FT ADLT (ELECTROSURGICAL) ×1 IMPLANT
GAUZE SPONGE 4X4 12PLY STRL (GAUZE/BANDAGES/DRESSINGS) ×1 IMPLANT
GAUZE SPONGE 4X4 8PLY STRL (GAUZE/BANDAGES/DRESSINGS) IMPLANT
GLOVE SURG SYN 8.0 (GLOVE) ×1 IMPLANT
GLOVE SURG SYN 8.0 PF PI (GLOVE) ×1 IMPLANT
GOWN STRL REUS W/ TWL LRG LVL3 (GOWN DISPOSABLE) ×2 IMPLANT
GOWN STRL REUS W/ TWL XL LVL3 (GOWN DISPOSABLE) ×1 IMPLANT
MANIFOLD NEPTUNE II (INSTRUMENTS) ×1 IMPLANT
MAT PREVALON FULL STRYKER (MISCELLANEOUS) ×1 IMPLANT
NDL HYPO 22X1.5 SAFETY MO (MISCELLANEOUS) ×1 IMPLANT
NEEDLE HYPO 22X1.5 SAFETY MO (MISCELLANEOUS) ×1 IMPLANT
NS IRRIG 1000ML POUR BTL (IV SOLUTION) ×1 IMPLANT
PACK C SECTION AR (MISCELLANEOUS) ×1 IMPLANT
PAD OB MATERNITY 4.3X12.25 (PERSONAL CARE ITEMS) ×1 IMPLANT
PAD PREP OB/GYN DISP 24X41 (PERSONAL CARE ITEMS) ×1 IMPLANT
SCRUB CHG 4% DYNA-HEX 4OZ (MISCELLANEOUS) ×1 IMPLANT
STRAP SAFETY 5IN WIDE (MISCELLANEOUS) ×1 IMPLANT
SUT CHROMIC 1 CTX 36 (SUTURE) ×3 IMPLANT
SUT CHROMIC 2 0 CT 1 (SUTURE) IMPLANT
SUT PLAIN GUT 0 (SUTURE) ×2 IMPLANT
SUT VIC AB 0 CT1 36 (SUTURE) ×2 IMPLANT
SYR 30ML LL (SYRINGE) ×2 IMPLANT
TRAP FLUID SMOKE EVACUATOR (MISCELLANEOUS) ×1 IMPLANT
WATER STERILE IRR 500ML POUR (IV SOLUTION) ×1 IMPLANT

## 2023-08-09 NOTE — Lactation Note (Signed)
This note was copied from a baby's chart. Lactation Consultation Note  Patient Name: Lauren Stephens Today's Date: 08/09/2023 Age:30 hours Reason for consult: Initial assessment;Preterm <34wks;NICU baby   Maternal Data Does the patient have breastfeeding experience prior to this delivery?: Yes How long did the patient breastfeed?: 1 month  Initial assessment w/ a P3 patient and a 11hr old baby Lauren "Lauren Stephens".  This was a c-section delivery due to PPROM.  Feeding goal is breastfeeding/formula feeding.  Infant is currently in NICU.  Patient does not have a pump at home and gave lactation permission to send off a referral.  Feeding Mother's Current Feeding Choice: Breast Milk and Donor Milk  Lactation Tools Discussed/Used Tools: Pump Breast pump type: Double-Electric Breast Pump Pump Education: Setup, frequency, and cleaning;Milk Storage Reason for Pumping: Preterm infant in NICU Pumping frequency: q3 or at least 8x w/in a 24hr period Pumped volume:  (Drops)  Lactation started patient pumping.  Patient is using a 21mm flange.  Drops of colostrum were seen in the rt flange.  This was taken to NICU to swab infants mouth.   Interventions Interventions: DEBP;Education;CDC Guidelines for Breast Pump Cleaning  LC provided education on how often to pump, 8x w/in a 24hr period.  Benefits of STS were discussed.      Discharge WIC Program: Yes  Consult Status Consult Status: Follow-up Date: 08/10/23 Follow-up type: In-patient    Lauren Stephens 08/09/2023, 3:48 PM

## 2023-08-09 NOTE — Progress Notes (Signed)
Anticoagulation monitoring(Lovenox):  32 yo female ordered Lovenox 40 mg Q24h    Filed Weights   08/05/23 1357 08/09/23 0303  Weight: 99.8 kg (220 lb) 95 kg (209 lb 7 oz)   BMI 39.6    Lab Results  Component Value Date   CREATININE 0.80 02/07/2023   CREATININE 0.77 02/01/2023   CREATININE 0.81 06/24/2022   CrCl cannot be calculated (Patient's most recent lab result is older than the maximum 21 days allowed.). Hemoglobin & Hematocrit     Component Value Date/Time   HGB 13.5 08/09/2023 0111   HGB 14.0 02/07/2023 1022   HCT 40.2 08/09/2023 0111   HCT 43.6 02/07/2023 1022     Per Protocol for Patient with estCrcl > 30 ml/min and BMI > 30, will transition to Lovenox 47.5 mg Q24h.

## 2023-08-09 NOTE — Anesthesia Preprocedure Evaluation (Signed)
Anesthesia Evaluation  Patient identified by MRN, date of birth, ID band Patient awake    Reviewed: Allergy & Precautions, NPO status , Patient's Chart, lab work & pertinent test results  History of Anesthesia Complications Negative for: history of anesthetic complications  Airway Mallampati: III  TM Distance: <3 FB Neck ROM: full    Dental  (+) Chipped   Pulmonary neg pulmonary ROS, neg shortness of breath   Pulmonary exam normal        Cardiovascular Exercise Tolerance: Good (-) hypertension(-) angina (-) Past MI negative cardio ROS Normal cardiovascular exam     Neuro/Psych  Neuromuscular disease    GI/Hepatic negative GI ROS,neg GERD  ,,  Endo/Other    Class 3 obesity  Renal/GU   negative genitourinary   Musculoskeletal   Abdominal   Peds  Hematology negative hematology ROS (+)   Anesthesia Other Findings Past Medical History: No date: Bacterial vaginosis     Comment:  10/2022  [facility] No date: Infertility, female     Comment:  f-u [facility]  History reviewed. No pertinent surgical history.  BMI    Body Mass Index: 41.57 kg/m      Reproductive/Obstetrics (+) Pregnancy                             Anesthesia Physical Anesthesia Plan  ASA: 3 and emergent  Anesthesia Plan: Spinal   Post-op Pain Management:    Induction:   PONV Risk Score and Plan:   Airway Management Planned: Natural Airway and Nasal Cannula  Additional Equipment:   Intra-op Plan:   Post-operative Plan:   Informed Consent: I have reviewed the patients History and Physical, chart, labs and discussed the procedure including the risks, benefits and alternatives for the proposed anesthesia with the patient or authorized representative who has indicated his/her understanding and acceptance.     Dental Advisory Given  Plan Discussed with: Anesthesiologist, CRNA and  Surgeon  Anesthesia Plan Comments: (Patient reports no bleeding problems and no anticoagulant use.  Plan for spinal with backup GA  Patient consented for risks of anesthesia including but not limited to:  - adverse reactions to medications - damage to eyes, teeth, lips or other oral mucosa - nerve damage due to positioning  - risk of bleeding, infection and or nerve damage from spinal that could lead to paralysis - risk of headache or failed spinal - damage to teeth, lips or other oral mucosa - sore throat or hoarseness - damage to heart, brain, nerves, lungs, other parts of body or loss of life  Patient voiced understanding and assent.)       Anesthesia Quick Evaluation

## 2023-08-09 NOTE — Anesthesia Postprocedure Evaluation (Signed)
Anesthesia Post Note  Patient: Lauren Stephens  Procedure(s) Performed: CESAREAN SECTION  Patient location during evaluation: Mother Baby Anesthesia Type: General Level of consciousness: awake and alert Pain management: pain level controlled Vital Signs Assessment: post-procedure vital signs reviewed and stable Respiratory status: spontaneous breathing, nonlabored ventilation, respiratory function stable and patient connected to nasal cannula oxygen Cardiovascular status: blood pressure returned to baseline and stable Postop Assessment: no apparent nausea or vomiting Anesthetic complications: no   No notable events documented.   Last Vitals:  Vitals:   08/09/23 0336 08/09/23 0540  BP: 130/73 120/75  Pulse: 67 70  Resp: (!) 22 20  Temp:  36.8 C  SpO2: 95% 96%    Last Pain:  Vitals:   08/09/23 0540  TempSrc: Oral  PainSc:                  Cleda Mccreedy Inice Sanluis

## 2023-08-09 NOTE — Progress Notes (Signed)
Post Partum Day 0  Subjective: Pain managed with PO meds.   No fever/chills, chest pain, shortness of breath, nausea/vomiting, or leg pain. No nipple or breast pain. No headache, visual changes, or RUQ/epigastric pain.  Objective: BP 115/70 (BP Location: Right Arm)   Pulse 69   Temp 97.8 F (36.6 C) (Oral)   Resp 17   Ht 5\' 1"  (1.549 m)   Wt 95 kg   LMP 11/17/2022   SpO2 97%   Breastfeeding Unknown   BMI 39.57 kg/m    Physical Exam:  General: alert and cooperative Breasts: soft/nontender CV: RRR Pulm: nl effort Abdomen: soft, non-tender Uterine Fundus: firm Incision: Pressure dressing in place Perineum: n/a Lochia: appropriate DVT Evaluation: No evidence of DVT seen on physical exam. Edinburgh:     06/20/2023    1:23 PM 08/08/2018   11:00 AM  Edinburgh Postnatal Depression Scale Screening Tool  I have been able to laugh and see the funny side of things. 0 0  I have looked forward with enjoyment to things. 0 0  I have blamed myself unnecessarily when things went wrong. 0 0  I have been anxious or worried for no good reason. 0 0  I have felt scared or panicky for no good reason. 0 0  Things have been getting on top of me. 0 1  I have been so unhappy that I have had difficulty sleeping. 0 0  I have felt sad or miserable. 0 0  I have been so unhappy that I have been crying. 0 0  The thought of harming myself has occurred to me. 0 0  Edinburgh Postnatal Depression Scale Total 0 1     Recent Labs    08/09/23 0111 08/09/23 0628  HGB 13.5 13.0  HCT 40.2 37.1  WBC 12.0* 23.0*  PLT 261 265    Assessment/Plan: 32 y.o. [id number] postpartum day # 0  1. Continue routine postpartum care - Encouraged ambulation today  2. Infant feeding status: breast and formula feeding -Lactation consult PRN for breastfeeding   3. Contraception plan: bilateral tubal ligation  4. Acute blood loss anemia - clinically not significant .  -Hemodynamically stable and  asymptomatic -Intervention: start on oral supplementation with ferrous sulfate 325 mg  and no intervention   5. Immunization status:   needs MMR prior to discharge   6. Elevated WBC - 12.0 to 23.0 Will continue to monitor, CBC in the am  7. Baby in SCN - Initiate pumping  Disposition: Continue inpatient postpartum care    LOS: 3 days   Dee Maday, CNM 08/09/2023, 8:15 AM

## 2023-08-09 NOTE — Progress Notes (Signed)
L&D Note    Subjective:  Reports more intense pelvic pressure and pain. Comes and goes every couple of minutes   Objective:   Vitals:   08/08/23 0828 08/08/23 1550 08/08/23 1937 08/08/23 2215  BP: 104/64 105/62 102/63   Pulse: 70 67 75   Resp: 20 20 20    Temp: 98.7 F (37.1 C) 98.5 F (36.9 C) 98 F (36.7 C) 98 F (36.7 C)  TempSrc: Oral Oral Oral Oral  SpO2: 100% 100% 99%   Weight:      Height:        Current Vital Signs 24h Vital Sign Ranges  T 98 F (36.7 C) Temp  Avg: 98.5 F (36.9 C)  Min: 98 F (36.7 C)  Max: 99.2 F (37.3 C)  BP 102/63 BP  Min: 102/63  Max: 106/67  HR 75 Pulse  Avg: 68.5  Min: 62  Max: 75  RR 20 Resp  Avg: 19.5  Min: 18  Max: 20  SaO2 99 % Room Air SpO2  Avg: 99.5 %  Min: 99 %  Max: 100 %      Gen: alert, cooperative, no distress FHR: Baseline: 125 bpm, Variability: moderate, Accels: Present, Decels: none Toco: q3-4 initially then irregular after fluid bolus  SVE: Dilation: 6 Effacement (%): 90 Cervical Position: Posterior Station: -2 Presentation: Double Footling Breech Exam by:: Margaretmary Eddy, CNM  Medications SCHEDULED MEDICATIONS   amoxicillin  250 mg Oral Q8H   azithromycin  250 mg Oral Daily   bupivacaine (PF)       prenatal multivitamin  1 tablet Oral Q1200   sodium chloride flush  50 mL Other Once   sodium citrate-citric acid  30 mL Oral 30 min Pre-Op    MEDICATION INFUSIONS   azithromycin     ceFAZolin      ceFAZolin (ANCEF) IV     sodium chloride 0.9 % with azithromycin (ZITHROMAX) ADS Med      PRN MEDICATIONS  acetaminophen, bupivacaine (PF), ceFAZolin, sodium chloride 0.9 % with azithromycin (ZITHROMAX) ADS Med   Assessment & Plan:  32 y.o. [id number] at [redacted]w[redacted]d admitted for pPROM  -Labor: Active phase labor. -Fetal Well-being: Category I -GBS: negative -Membranes pPROM on 08/05/2023 -Intervention: plan Cesarean delivery d/t active labor and breech presentation.  -Dr. Beverly Gust called for delivery  -OR team  notified  -Stat labs ordered - will not wait for results before proceeding to OR  -Analgesia: per anesthesia team  -NICU team called for delivery    Gustavo Lah, CNM  08/09/2023 1:04 AM  [facility]

## 2023-08-09 NOTE — Discharge Summary (Signed)
Postpartum Discharge Summary  Patient Name: Lauren Stephens DOB: 1990/09/06 MRN: [id number]  Date of admission: 08/05/2023 Delivery date:08/09/2023 Delivering provider: Suzy Bouchard Date of discharge: 08/12/2023  Primary OB: [facility] WUJ:[id number] last menstrual period was 11/17/2022. EDC Estimated Date of Delivery: 09/26/23 Gestational Age at Delivery: [redacted]w[redacted]d   Admitting diagnosis: Vaginal discharge during pregnancy in third trimester [O26.893, N89.8] Intrauterine pregnancy: [redacted]w[redacted]d     Secondary diagnosis:   Principal Problem:   Preterm premature rupture of membranes in third trimester   Discharge Diagnosis: Preterm Pregnancy Delivered and pPROM       Hospital course: Induction of Labor With Cesarean Section   32 y.o. yo [phone] at [redacted]w[redacted]d was admitted to the hospital 08/05/2023 for pPROM, advanced cervical dilation and breech. The patient went for cesarean section due to Malpresentation. Delivery details are as follows:  Membrane Rupture Time/Date: 12:30 PM,08/05/2023  Delivery Method:C-Section, Low Transverse Operative Delivery:N/A Details of operation can be found in separate operative Note.  Patient had a postpartum course complicated by anemia. She is ambulating, tolerating a regular diet, passing flatus, and urinating well.  Patient is discharged home in stable condition on 08/12/23.      Newborn Data: Birth date:08/09/2023 Birth time:1:58 AM Gender:Female Living status:Living Apgars:7 ,9  Weight:2050 g                                                                         Post partum procedures: none Complications: ROM>24 hours Delivery Type: primary cesarean section, low transverse incision Anesthesia: General Anesthesia Placenta: spontaneous and manual removal To Pathology: No   Prenatal Labs:  MBT: O pos Ab screen Hep B   Hep C Rubella NON Immune  VZ:NON Immune HIV : neg  RPR : neg  GBS : presumptive neg   Magnesium Sulfate received: No BMZ  received: Yes Rhophylac:was not indicated MMR: was given Varivax vaccine given: was given T-DaP:Given prenatally Flu: Given prenatally  Transfusion:No  Physical exam  Vitals:   08/11/23 0835 08/11/23 1653 08/12/23 0200 08/12/23 0836  BP: 101/66 117/89 (!) 102/53 114/74  Pulse: 60 79 67 80  Resp: 18  18 18   Temp: 98.7 F (37.1 C) 98 F (36.7 C) 98.3 F (36.8 C) 98.3 F (36.8 C)  TempSrc: Oral Oral Oral Oral  SpO2: 98% 99% 98% 100%  Weight:      Height:       General: alert, cooperative, and no distress Lochia: appropriate Uterine Fundus: firm Perineum:minimal edema/intact Incision: Healing well with no significant drainage, covered with occlusive OP site dressing  DVT Evaluation: No evidence of DVT seen on physical exam.  Labs: Lab Results  Component Value Date   WBC 16.7 (H) 08/10/2023   HGB 11.2 (L) 08/10/2023   HCT 33.3 (L) 08/10/2023   MCV 83.3 08/10/2023   PLT 246 08/10/2023      Latest Ref Rng & Units 02/07/2023   10:22 AM  CMP  Glucose 70 - 99 mg/dL 086   BUN 6 - 20 mg/dL 5   Creatinine 5.78 - 4.69 mg/dL 6.29   Sodium 528 - 413 mmol/L 139   Potassium 3.5 - 5.2 mmol/L 3.6   Chloride 96 - 106 mmol/L 100   CO2 20 - 29  mmol/L 21   Calcium 8.7 - 10.2 mg/dL 9.6   Total Protein 6.0 - 8.5 g/dL 6.9   Total Bilirubin 0.0 - 1.2 mg/dL 0.4   Alkaline Phos 44 - 121 IU/L 85   AST 0 - 40 IU/L 16   ALT 0 - 32 IU/L 26    Edinburgh Score:    08/10/2023    9:20 AM  Edinburgh Postnatal Depression Scale Screening Tool  I have been able to laugh and see the funny side of things. 0  I have looked forward with enjoyment to things. 0  I have blamed myself unnecessarily when things went wrong. 0  I have been anxious or worried for no good reason. 2  I have felt scared or panicky for no good reason. 0  Things have been getting on top of me. 1  I have been so unhappy that I have had difficulty sleeping. 0  I have felt sad or miserable. 0  I have been so unhappy that I  have been crying. 0  The thought of harming myself has occurred to me. 0  Edinburgh Postnatal Depression Scale Total 3    Risk assessment for postpartum VTE and prophylactic treatment: Very high risk factors: None High risk factors: Unscheduled cesarean after labor  Moderate risk factors: BMI 30-40 kg/m2  Postpartum VTE prophylaxis with LMWH not indicated  After visit meds:  Allergies as of 08/12/2023   No Known Allergies      Medication List     STOP taking these medications    amoxicillin-clavulanate 875-125 MG tablet Commonly known as: AUGMENTIN   aspirin EC 81 MG tablet   clotrimazole-betamethasone cream Commonly known as: LOTRISONE   ondansetron 4 MG disintegrating tablet Commonly known as: ZOFRAN-ODT   promethazine 25 MG tablet Commonly known as: PHENERGAN       TAKE these medications    acetaminophen 500 MG tablet Commonly known as: TYLENOL Take 2 tablets (1,000 mg total) by mouth every 6 (six) hours. What changed:  when to take this reasons to take this   ibuprofen 600 MG tablet Commonly known as: ADVIL Take 1 tablet (600 mg total) by mouth every 6 (six) hours.   oxyCODONE 5 MG immediate release tablet Commonly known as: Oxy IR/ROXICODONE Take 1-2 tablets (5-10 mg total) by mouth every 4 (four) hours as needed for moderate pain (pain score 4-6).   Prenatal Plus Vitamin/Mineral 27-1 MG Tabs Take 1 tablet by mouth daily.       Discharge home in stable condition Infant Feeding:  pumping and bottle feeding Infant Disposition:NICU Discharge instruction: per After Visit Summary and Postpartum booklet. Activity: Advance as tolerated. Pelvic rest for 6 weeks.  Diet: routine diet Anticipated Birth Control: BTL done PP Postpartum Appointment:6 weeks Additional Postpartum F/U: Incision check 1 week Future Appointments:No future appointments. Follow up Visit:  Follow-up Information     Schermerhorn, Ihor Austin, MD. Schedule an appointment as soon  as possible for a visit in 1 week(s).   Specialty: Obstetrics and Gynecology Why: for a post op visit and to discuss tubal Contact information: [address] [place] [address] [phone]                 Plan:  Lauren Stephens was discharged to home in good condition. Follow-up appointment as directed.    Signed: Cyril Mourning 08/12/2023 10:37 AM

## 2023-08-09 NOTE — Transfer of Care (Signed)
Immediate Anesthesia Transfer of Care Note  Patient: Lauren Stephens  Procedure(s) Performed: CESAREAN SECTION  Patient Location: PACU and Mother/Baby  Anesthesia Type:General  Level of Consciousness: awake, drowsy, and patient cooperative  Airway & Oxygen Therapy: Patient Spontanous Breathing  Post-op Assessment: Report given to RN and Post -op Vital signs reviewed and stable  Post vital signs: Reviewed and stable  Last Vitals:  Vitals Value Taken Time  BP 116/68 08/09/2023 0245  Temp    Pulse 86   Resp 16   SpO2 95     Last Pain:  Vitals:   08/08/23 2215  TempSrc: Oral  PainSc:       Patients Stated Pain Goal: 0 (08/07/23 0911)  Complications: No notable events documented.

## 2023-08-09 NOTE — Brief Op Note (Signed)
08/05/2023 - 08/09/2023  2:29 AM  PATIENT:  Lauren Stephens  32 y.o. female  PRE-OPERATIVE DIAGNOSIS:  PPROM, advanced cervical dilation , breech presentation   POST-OPERATIVE DIAGNOSIS:  same , vigorous female   PROCEDURE:  Procedure(s): CESAREAN SECTION LTCS . Breech extraction   SURGEON:  Surgeons and Role:    * Sheylin Scharnhorst, Ihor Austin, MD - Primary  PHYSICIAN ASSISTANT: CNM , Margaretmary Eddy  ASSISTANTS: cst   ANESTHESIA:   general  EBL:  QBL : 520 cc IOF 400 cc, UO 125cc  BLOOD ADMINISTERED:none  DRAINS: Urinary Catheter (Foley)   LOCAL MEDICATIONS USED:  MARCAINE     SPECIMEN:  No Specimen  DISPOSITION OF SPECIMEN:  N/A  COUNTS:  YES  TOURNIQUET:  * No tourniquets in log *  DICTATION: .Other Dictation: Dictation Number verbal  PLAN OF CARE: Admit to inpatient   PATIENT DISPOSITION:  PACU - hemodynamically stable.   Delay start of Pharmacological VTE agent (>24hrs) due to surgical blood loss or risk of bleeding: not applicable

## 2023-08-09 NOTE — Anesthesia Procedure Notes (Signed)
Procedure Name: Intubation Date/Time: 08/09/2023 1:55 AM  Performed by: Elmarie Mainland, CRNAPre-anesthesia Checklist: Patient identified, Emergency Drugs available, Suction available and Patient being monitored Patient Re-evaluated:Patient Re-evaluated prior to induction Oxygen Delivery Method: Circle system utilized Preoxygenation: Pre-oxygenation with 100% oxygen Induction Type: IV induction, Rapid sequence and Cricoid Pressure applied Laryngoscope Size: McGrath and 3 Grade View: Grade I Tube type: Oral Tube size: 6.0 mm Number of attempts: 1 Airway Equipment and Method: Video-laryngoscopy and Stylet Placement Confirmation: ETT inserted through vocal cords under direct vision, positive ETCO2 and breath sounds checked- equal and bilateral Secured at: 21 cm Tube secured with: Tape Dental Injury: Teeth and Oropharynx as per pre-operative assessment

## 2023-08-09 NOTE — Progress Notes (Signed)
Pt with advanced cervical dilation . 6 cm . Breech . Need to proceed to cesarean section . I have counseled the patient for surgery .  The risks of cesarean section discussed with the patient included but were not limited to: bleeding which may require transfusion or reoperation; infection which may require antibiotics; injury to bowel, bladder, ureters or other surrounding organs; injury to the fetus; need for additional procedures including hysterectomy in the event of a life-threatening hemorrhage; placental abnormalities wth subsequent pregnancies, incisional problems, thromboembolic phenomenon and other postoperative/anesthesia complications. The patient concurred with the proposed plan, giving informed written consent for the procedure.   . Anesthesia and OR aware. Preoperative prophylactic antibiotics and SCDs ordered on call to the OR.  To OR when ready.

## 2023-08-09 NOTE — Op Note (Signed)
NAME: Lauren Stephens, Lauren Stephens MEDICAL RECORD NO: [id number] ACCOUNT NO: [id number] DATE OF BIRTH: 10-Mar-1991 FACILITY: [facility] LOCATION: [facility] PHYSICIAN: Suzy Bouchard, MD  Operative Report   DATE OF PROCEDURE: 08/09/2023  PREOPERATIVE DIAGNOSES: 1.  33+1 weeks estimated gestational age. 2.  Premature preterm rupture of membranes at 32+4 weeks. 3.  Advanced cervical dilation. 4.  Breech presentation.  POSTOPERATIVE DIAGNOSES: 1.  33+1 weeks estimated gestational age. 2.  Premature preterm rupture of membranes at 32+4 weeks. 3.  Advanced cervical dilation. 4.  Breech presentation. 5.  Vigorous female delivered.  PROCEDURE:   1.  Primary low transverse cesarean section.   2.  Breech extraction.  SURGEON:  Suzy Bouchard, MD  FIRST ASSISTANT:  Kathrin Ruddy, certified nurse midwife.  ANESTHESIA:  General endotracheal anesthesia.  INDICATIONS:  This is a 33 year old gravida 3, para 2.  The patient admitted at 32+4 weeks with premature preterm rupture of membranes and was admitted and placed on latency antibiotics.  Approximately 4 hours before the procedure, the patient started  having contractions and pelvic pain and on cervical exam, her cervix was 6 cm with presenting parts through the cervix, not extruding all the way into the vagina.  DESCRIPTION OF PROCEDURE:  An initial attempt for spinal anesthesia was performed, which was unsuccessful.  Therefore, the patient underwent general endotracheal anesthesia.  The patient had been previously prepped, draped, Foley catheter placed, and 2 g  IV Ancef placed.  Before the procedure started, it was thought that her IV had infiltrated and another IV site was placed.  Once she underwent general endotracheal anesthesia, a Pfannenstiel incision was made 2 fingerbreadths above the synthesis pubis.   Sharp dissection was used to identify the fascia.  The fascia was opened in the midline and opened in a transverse fashion.  The  superior aspect of the fascia was grasped with Kocher clamps and the recti muscles were dissected free.  Inferior aspect of  the fascia was grasped with Kocher clamps and pyramidalis muscle was dissected free.  Entry into the peritoneal cavity was accomplished sharply.  The vesicouterine peritoneal fold was identified and the bladder flap was created and the bladder was  reflected inferiorly.  A low transverse uterine incision was made upon entry into the endometrial cavity.  Minimal fluid was noted.  The incision was extended with blunt transverse traction.  Fetal buttocks was then delivered through the incision  followed by the legs.  The lower abdomen was draped with a wound towel.  Each arm was delivered with a medial sweeping maneuver.  The head was delivered with a Mauriceau-Smellie-Veit  maneuver.  Initially, a floppy female was placed on the mother's abdomen  and the cord was doubly clamped and female infant was passed to nursery staff.  Spontaneous cry was heard shortly after.  APGAR score was pending.  Cord blood was obtained.  The placenta was manually delivered and the uterus was exteriorized.  Endometrial  cavity was wiped clean through laparotomy tape.  Uterine incision was then closed with 1 chromic suture in a running locking fashion.  Two additional figure-of-eight sutures were required.  Good hemostasis noted.  Fallopian tubes and ovaries appeared  normal.  Posterior cul-de-sac was irrigated and suctioned and the uterus was placed back into the abdominal cavity.  Paracolic gutters were wiped cleaned with laparotomy tape.  The uterine incision again appeared hemostatic.  The fascia was then closed  with 0 Vicryl suture in a running non-locking fashion.  Two separate sutures  used.  Good approximation of tissue.  Subcutaneous tissues were irrigated and Bovie for hemostasis and given the depth of the subcutaneous tissues of 3.5 cm, the dead space was  closed with a running 2-0 chromic suture.  Skin  was reapproximated with Insorb absorbable staples and approximately 30 mL of 0.25% Marcaine solution was injected beneath the skin.  There were no complications.  Certified nurse midwife Mackey's assistance  was deemed necessary for the procedure for retraction, visualization, and fundal pressure for delivery of the child.  ESTIMATED BLOOD LOSS:  520 mL.  INTRAOPERATIVE FLUIDS:  400 mL  URINE OUTPUT:  125 mL  The patient did receive an additional 1 g of IV Ancef through the working IV during the procedure and she received 30 mg intravenous Toradol at the end of the procedure as well.  The patient tolerated the procedure well.      Xaver.Mink D: 08/09/2023 2:47:53 am T: 08/09/2023 7:59:00 am  JOB: [id number]/ [id number]

## 2023-08-09 NOTE — Progress Notes (Signed)
Due urgency of needing to perform the c section, a time out was not performed prior to incision. Disregard one documented in the OR navigator as one was imputed for signing and verifying the case.  Kellyann Ordway A RN

## 2023-08-10 LAB — CBC
HCT: 33.3 % — ABNORMAL LOW (ref 36.0–46.0)
Hemoglobin: 11.2 g/dL — ABNORMAL LOW (ref 12.0–15.0)
MCH: 28 pg (ref 26.0–34.0)
MCHC: 33.6 g/dL (ref 30.0–36.0)
MCV: 83.3 fL (ref 80.0–100.0)
Platelets: 246 10*3/uL (ref 150–400)
RBC: 4 MIL/uL (ref 3.87–5.11)
RDW: 15.1 % (ref 11.5–15.5)
WBC: 16.7 10*3/uL — ABNORMAL HIGH (ref 4.0–10.5)
nRBC: 0 % (ref 0.0–0.2)

## 2023-08-10 LAB — VARICELLA ZOSTER ANTIBODY, IGG: Varicella IgG: NONREACTIVE

## 2023-08-10 MED ORDER — IBUPROFEN 600 MG PO TABS
600.0000 mg | ORAL_TABLET | Freq: Four times a day (QID) | ORAL | Status: DC
  Administered 2023-08-10 – 2023-08-12 (×8): 600 mg via ORAL
  Filled 2023-08-10 (×8): qty 1

## 2023-08-10 MED ORDER — ACETAMINOPHEN 500 MG PO TABS
1000.0000 mg | ORAL_TABLET | Freq: Four times a day (QID) | ORAL | Status: DC
  Administered 2023-08-10 – 2023-08-12 (×8): 1000 mg via ORAL
  Filled 2023-08-10 (×8): qty 2

## 2023-08-10 NOTE — Lactation Note (Signed)
This note was copied from a baby's chart. Lactation Consultation Note  Patient Name: Lauren Stephens [id number] Date: 08/10/2023 Age:30 hours Reason for consult: Follow-up assessment;Preterm <34wks;NICU baby   Maternal Data This is baby's 3rd baby, C/S. Baby is 33 2/7 weeks in SCN. Mom with history of obesity, and PPROM with this baby.  On follow-up today mom reports she received a call from the [facility] representative to arrange to pick up a breastpump for home use. Mom reports she told the [facility] rep she would call her back when she was feeling able to pick -up the pump after her discharge to home. Discussed with mom how the body knows to make milk and importance of consistent pumping with a hospital grade pump if she plans to provide milk. Encouraged mom to pick-up the pump as soon after discharge as possible. Mom will consider LC information. Per mom she knows how to use the hand pump but she tried it with her last baby and does not like using it. Discussed hand expression and use of DEBP as optimal vs manual pump.Mom has not been consistently pumping with the DEBP.  Has patient been taught Hand Expression?: Yes Does the patient have breastfeeding experience prior to this delivery?: Yes How long did the patient breastfeed?: 1 month  Feeding Mother's Current Feeding Choice: Breast Milk and Donor Milk    Lactation Tools Discussed/Used Tools: Pump Breast pump type: Double-Electric Breast Pump Pump Education: Setup, frequency, and cleaning;Milk Storage Reason for Pumping: Preterm infant Pumping frequency: LC recommended mom goal for 8 times/24 hours Pumped volume:  (mom reports when she does pump she gets a small mount from her left breast and nothing from her right breast.)  Interventions Interventions: Education Encouragement and support provided to this pumping mom of preterm infant.  Discharge Pump: WIC Loaner [facility] rep called mom to assist mom with arranging pick-up of  pump.)  Consult Status Consult Status: Follow-up Date: 08/11/23 Follow-up type: In-patient  Update provided to [facility] care nurse.   Fuller Song 08/10/2023, 2:35 PM

## 2023-08-10 NOTE — Progress Notes (Signed)
Post Partum/Post-Operative Day 1 Subjective: Doing well, no complaints.  Tolerating regular diet, pain with PO meds, voiding and ambulating without difficulty. Incision is uncomfortable, but she denies concerns.  No CP SOB Fever,Chills, N/V or leg pain; denies nipple or breast pain, no HA change of vision, RUQ/epigastric pain  Objective: BP 102/67 (BP Location: Left Arm)   Pulse 77   Temp 98.2 F (36.8 C) (Oral)   Resp 17   Ht 5\' 1"  (1.549 m)   Wt 95 kg   LMP 11/17/2022   SpO2 97%   Breastfeeding Unknown   BMI 39.57 kg/m    Physical Exam:  General: NAD Breasts: soft/nontender  CV: RRR Pulm: nl effort, CTABL Abdomen: soft, NT, BS x 4 Incision: Dsg CDI/occlusive pressure dressing intact/no erythema or drainage Lochia: moderate Uterine Fundus: fundus firm and 1 fb below umbilicus DVT Evaluation: no cords, ttp LEs   Recent Labs    08/09/23 0628 08/10/23 0523  HGB 13.0 11.2*  HCT 37.1 33.3*  WBC 23.0* 16.7*  PLT 265 246    Assessment/Plan: 32 y.o. [id number] postpartum/post-operative day # 1  - Continue routine PP care - Lactation consult PRN, she is pumping. - Discussed contraceptive options including implant, IUDs hormonal and non-hormonal, injection, pills/ring/patch, condoms, and NFP.  - Acute blood loss anemia, clinically insignificant - hemoglobin changed from 13.0 to 11.2, patient is asymptomatic, hemodynamically stable; continue po ferrous sulfate BID with stool softeners - Immunization status: Needs MMR prior to DC - Infant is in NICU d/t prematurity, mother has been to NICU to see him  Disposition: Does not desire Dc home today.   Janyce Llanos, CNM 08/10/2023 9:09 AM

## 2023-08-11 ENCOUNTER — Ambulatory Visit: Payer: Medicaid Other

## 2023-08-11 NOTE — Plan of Care (Signed)
  Problem: Health Behavior/Discharge Planning: Goal: Ability to manage health-related needs will improve Outcome: Progressing   Problem: Clinical Measurements: Goal: Ability to maintain clinical measurements within normal limits will improve Outcome: Progressing   Problem: Education: Goal: Knowledge of condition will improve Outcome: Progressing

## 2023-08-11 NOTE — Progress Notes (Signed)
Postop Day  2  Subjective: 32 y.o. [id number] postpartum day #2 status post primary cesarean section. She is ambulating, is tolerating po, is voiding spontaneously.  Her pain is well controlled on PO pain medications. Her lochia is less than menses.  Objective: BP 101/66 (BP Location: Left Arm)   Pulse 60   Temp 98.7 F (37.1 C) (Oral)   Resp 18   Ht 5\' 1"  (1.549 m)   Wt 95 kg   LMP 11/17/2022   SpO2 98%   Breastfeeding Unknown   BMI 39.57 kg/m    Physical Exam:  General: alert, cooperative, and appears stated age Breasts: soft/nontender Pulm: nl effort Abdomen: soft, non-tender, active bowel sounds Uterine Fundus: firm Incision: healing well, no significant drainage, no dehiscence, no significant erythema Perineum: no edema, intact Lochia: appropriate DVT Evaluation: No evidence of DVT seen on physical exam. Negative Homan's sign. No cords or calf tenderness. No significant calf/ankle edema.  Recent Labs    08/09/23 0628 08/10/23 0523  HGB 13.0 11.2*  HCT 37.1 33.3*  WBC 23.0* 16.7*  PLT 265 246    Assessment/Plan: 32 y.o. [id number] postpartum/ postop day # 2  1. Continue routine postpartum care  2. Infant feeding status: expressed breast milk pumping --Lactation consult PRN for breastfeeding   3. Contraception plan: bilateral tubal ligation  4. Acute blood loss anemia - clinically significant.  --Hemodynamically stable and asymptomatic --Intervention: continue on oral supplementation with ferrous sulfate 325  Disposition: continue inpatient postpartum care , plan for discharge home tomorrow    LOS: 5 days   Chari Manning, CNM 08/11/2023, 12:18 PM   ----- Chari Manning Certified Nurse Midwife [facility]

## 2023-08-12 MED ORDER — ACETAMINOPHEN 500 MG PO TABS: 1000.0000 mg | ORAL_TABLET | Freq: Four times a day (QID) | ORAL | 0 refills | Status: AC

## 2023-08-12 MED ORDER — VARICELLA VIRUS VACCINE LIVE 1350 PFU/0.5ML IJ SUSR: 0.5000 mL | INTRAMUSCULAR | Status: DC | PRN

## 2023-08-12 MED ORDER — OXYCODONE HCL 5 MG PO TABS: 5.0000 mg | ORAL_TABLET | ORAL | 0 refills | Status: DC | PRN

## 2023-08-12 MED ORDER — IBUPROFEN 600 MG PO TABS: 600.0000 mg | ORAL_TABLET | Freq: Four times a day (QID) | ORAL | 0 refills | Status: AC

## 2023-08-12 NOTE — Lactation Note (Signed)
This note was copied from a baby's chart. Lactation Consultation Note  Patient Name: Lauren Stephens [id number] Date: 08/12/2023 Age:30 days Reason for consult: Follow-up assessment;NICU baby;Preterm <34wks;Maternal discharge   Maternal Data How long did the patient breastfeed?: approx 1 mth States she never made much milk with her other babies Feeding Mother's Current Feeding Choice: Breast Milk and Donor Milk  LATCH Score                    Lactation Tools Discussed/Used Tools: Pump Breast pump type: Double-Electric Breast Pump Reason for Pumping: baby in SCN Pumping frequency: encouraged to pump q 3hr or 8x/24 hrs Pumped volume:  (has not pumped today) Mom shown how to use manual breast pump for home use Interventions    Discharge Discharge Education: Engorgement and breast care Pump: DEBP WIC Program: Yes Call [facility] Monday am to arrange to pick up breast pump for home use Consult Status Consult Status: PRN Date: 08/12/23 Follow-up type: In-patient    Dyann Kief 08/12/2023, 2:34 PM

## 2023-08-12 NOTE — Progress Notes (Signed)
Pt discharged with infant.  Discharge instructions, prescriptions and follow up appointment given to and reviewed with pt. Pt verbalized understanding. Escorted out by staff. 

## 2023-08-12 NOTE — Plan of Care (Signed)
   Problem: Health Behavior/Discharge Planning: Goal: Ability to manage health-related needs will improve Outcome: Progressing   Problem: Pain Management: Goal: General experience of comfort will improve Outcome: Progressing

## 2023-08-14 ENCOUNTER — Encounter: Payer: Self-pay | Admitting: Obstetrics and Gynecology

## 2023-08-17 ENCOUNTER — Other Ambulatory Visit: Payer: Medicaid Other

## 2023-08-24 ENCOUNTER — Other Ambulatory Visit: Payer: Medicaid Other

## 2023-08-25 ENCOUNTER — Ambulatory Visit: Payer: Medicaid Other

## 2023-08-30 ENCOUNTER — Ambulatory Visit: Payer: Medicaid Other

## 2023-09-06 ENCOUNTER — Other Ambulatory Visit: Payer: Medicaid Other

## 2023-09-19 NOTE — H&P (Signed)
Ms. Hortencia Pilar is a 33 y.o. female here for Post Operative Visit .pt is here to discuss contraception . Interested in  L/S BTl                                                             L    Baby still in nursery    Past Medical History:  has a past medical history of Obesity, Oligomenorrhea, and Thyroid disease.  Past Surgical History:  has a past surgical history that includes Cesarean section (08/09/2023). Family History: family history includes Arthritis in her maternal grandmother and mother. Social History:  reports that she has never smoked. She has never used smokeless tobacco. She reports that she does not drink alcohol and does not use drugs. OB/GYN History:  OB History       Gravida  3   Para  3   Term  2   Preterm  1   AB      Living  3        SAB      IAB      Ectopic      Molar      Multiple      Live Births  3             Allergies: has No Known Allergies. Medications:  Current Medications    Current Outpatient Medications:    letrozole (FEMARA) 2.5 mg tablet, Take 1 pill by mouth on Day 3-7 of your cycle (Patient not taking: Reported on 08/24/2023), Disp: 5 tablet, Rfl: 0   letrozole (FEMARA) 2.5 mg tablet, Take 2 tabs po q day days 3-7 (Patient not taking: Reported on 08/24/2023), Disp: 10 tablet, Rfl: 0   medroxyPROGESTERone (PROVERA) 10 MG tablet, Provera 10 mg  1 tablet daily x 10 days (Patient not taking: Reported on 08/24/2023), Disp: 10 tablet, Rfl: 2   medroxyPROGESTERone (PROVERA) 10 MG tablet, Take 1 tablet (10 mg total) by mouth once daily (Patient not taking: Reported on 08/24/2023), Disp: 10 tablet, Rfl: 0   medroxyPROGESTERone (PROVERA) 10 MG tablet, Take 1 tablet (10 mg total) by mouth once daily (Patient not taking: Reported on 08/24/2023), Disp: 10 tablet, Rfl: 0   nystatin (MYCOSTATIN) 100,000 unit/gram cream, Apply to affected area every day for 1 week then use twice weekly from then on (Patient not taking: Reported on 08/24/2023),  Disp: 30 g, Rfl: 0   ondansetron (ZOFRAN) 4 MG tablet, Take 4 mg by mouth every 8 (eight) hours as needed for Nausea (Patient not taking: Reported on 08/24/2023), Disp: , Rfl:    prenatal vitamin with iron-folic acid (PRENATAL TABLETS) tablet, Take 1 tablet by mouth once daily (Patient not taking: Reported on 08/24/2023), Disp: , Rfl:    triamcinolone 0.1 % cream, Apply to affected area every day for 1 week then use twice weekly from then on (Patient not taking: Reported on 08/24/2023), Disp: 30 g, Rfl: 0     Review of Systems: General:                      No fatigue or weight loss Eyes:                           No  vision changes Ears:                            No hearing difficulty Respiratory:                No cough or shortness of breath Pulmonary:                  No asthma or shortness of breath Cardiovascular:           No chest pain, palpitations, dyspnea on exertion Gastrointestinal:          No abdominal bloating, chronic diarrhea, constipations, masses, pain or hematochezia Genitourinary:             No hematuria, dysuria, abnormal vaginal discharge, pelvic pain, Menometrorrhagia Lymphatic:                   No swollen lymph nodes Musculoskeletal:No muscle weakness Neurologic:                  No extremity weakness, syncope, seizure disorder Psychiatric:                  No history of depression, delusions or suicidal/homicidal ideation      Exam:       Vitals:    09/21/23 1423  BP: 117/79  Pulse: 79      Body mass index is 38.92 kg/m.   WDWN / black female in NAD   Lungs: CTA  CV : RRR without murmur   Breast: exam done in sitting and lying position : No dimpling or retraction, no dominant mass, no spontaneous discharge, no axillary adenopathy Neck:  no thyromegaly Abdomen: soft , no mass, normal active bowel sounds,  non-tender, no rebound tenderness  Dressing removed , Incision looks good      Impression:    The encounter diagnosis was Encounter for  sterilization.       Plan:  L/S BTL falope rings or cautery  Surgery risks discussed at [facility] , see notes    . Medicaid papers already signed .          No follow-ups on file.   Vilma Prader, MD

## 2023-09-21 ENCOUNTER — Other Ambulatory Visit: Payer: Medicaid Other

## 2023-09-22 ENCOUNTER — Encounter
Admission: RE | Admit: 2023-09-22 | Discharge: 2023-09-22 | Disposition: A | Discharge status: Home / Self Care | Payer: Medicaid Other | Source: Ambulatory Visit | Attending: Obstetrics and Gynecology | Admitting: Obstetrics and Gynecology

## 2023-09-22 HISTORY — DX: Insulin resistance, unspecified: E88.819

## 2023-09-22 HISTORY — DX: Unspecified ovarian cyst, unspecified side: N83.209

## 2023-09-22 HISTORY — DX: Morbid (severe) obesity due to excess calories: E66.01

## 2023-09-22 HISTORY — DX: Unspecified ovarian cyst, unspecified side: O34.80

## 2023-09-22 HISTORY — DX: Anemia, unspecified: D64.9

## 2023-09-22 NOTE — Patient Instructions (Signed)
Your procedure is scheduled on:09-29-23 Friday Report to the Registration Desk on the 1st floor of the [facility].Then proceed to the 2nd floor Surgery Desk To find out your arrival time, please call [phone] between 1PM - 3PM on:09-28-23 Thursday If your arrival time is 6:00 am, do not arrive before that time as the [facility] entrance doors do not open until 6:00 am.  REMEMBER: Instructions that are not followed completely may result in serious medical risk, up to and including death; or upon the discretion of your surgeon and anesthesiologist your surgery may need to be rescheduled.  Do not eat food after midnight the night before surgery.  No gum chewing or hard candies.  You may however, drink CLEAR liquids up to 2 hours before you are scheduled to arrive for your surgery. Do not drink anything within 2 hours of your scheduled arrival time.  Clear liquids include: - water  - apple juice without pulp - gatorade (not RED colors) - black coffee or tea (Do NOT add milk or creamers to the coffee or tea) Do NOT drink anything that is not on this list.  In addition, your doctor has ordered for you to drink the provided:  Ensure Pre-Surgery Clear Carbohydrate Drink  Drinking this carbohydrate drink up to two hours before surgery helps to reduce insulin resistance and improve patient outcomes. Please complete drinking 2 hours before scheduled arrival time.  One week prior to surgery:Stop NOW (09-22-23) Stop Anti-inflammatories (NSAIDS) such as Advil, Aleve, Ibuprofen, Motrin, Naproxen, Naprosyn and Aspirin based products such as Excedrin, Goody's Powder, BC Powder. Stop ANY OVER THE COUNTER supplements until after surgery (Prenatal Vitamin)  You may however, continue to take Tylenol if needed for pain up until the day of surgery.  Continue taking all of your other prescription medications up until the day of surgery.  Do NOT take any medication the day of surgery  No Alcohol for 24  hours before or after surgery.  No Smoking including e-cigarettes for 24 hours before surgery.  No chewable tobacco products for at least 6 hours before surgery.  No nicotine patches on the day of surgery.  Do not use any "recreational" drugs for at least a week (preferably 2 weeks) before your surgery.  Please be advised that the combination of cocaine and anesthesia may have negative outcomes, up to and including death. If you test positive for cocaine, your surgery will be cancelled.  On the morning of surgery brush your teeth with toothpaste and water, you may rinse your mouth with mouthwash if you wish. Do not swallow any toothpaste or mouthwash.  Use CHG Soap as directed on instruction sheet.  Do not wear jewelry, make-up, hairpins, clips or nail polish.  For welded (permanent) jewelry: bracelets, anklets, waist bands, etc.  Please have this removed prior to surgery.  If it is not removed, there is a chance that hospital personnel will need to cut it off on the day of surgery.  Do not wear lotions, powders, or perfumes.   Do not shave body hair from the neck down 48 hours before surgery.  Contact lenses, hearing aids and dentures may not be worn into surgery.  Do not bring valuables to the hospital. [facility] is not responsible for any missing/lost belongings or valuables.   Notify your doctor if there is any change in your medical condition (cold, fever, infection).  Wear comfortable clothing (specific to your surgery type) to the hospital.  After surgery, you can help  prevent lung complications by doing breathing exercises.  Take deep breaths and cough every 1-2 hours. Your doctor may order a device called an Incentive Spirometer to help you take deep breaths. When coughing or sneezing, hold a pillow firmly against your incision with both hands. This is called "splinting." Doing this helps protect your incision. It also decreases belly discomfort.  If you are being  admitted to the hospital overnight, leave your suitcase in the car. After surgery it may be brought to your room.  In case of increased patient census, it may be necessary for you, the patient, to continue your postoperative care in the Same Day Surgery department.  If you are being discharged the day of surgery, you will not be allowed to drive home. You will need a responsible individual to drive you home and stay with you for 24 hours after surgery.   If you are taking public transportation, you will need to have a responsible individual with you.  Please call the Pre-admissions Testing Dept. at [phone] if you have any questions about these instructions.  Surgery Visitation Policy:  Patients having surgery or a procedure may have two visitors.  Children under the age of 70 must have an adult with them who is not the patient.  Temporary Visitor Restrictions Due to increasing cases of flu, RSV and COVID-19: Children ages 78 and under will not be able to visit patients in [facility] hospitals under most circumstances.     Preparing for Surgery with CHLORHEXIDINE GLUCONATE (CHG) Soap  Chlorhexidine Gluconate (CHG) Soap  o An antiseptic cleaner that kills germs and bonds with the skin to continue killing germs even after washing  o Used for showering the night before surgery and morning of surgery  Before surgery, you can play an important role by reducing the number of germs on your skin.  CHG (Chlorhexidine gluconate) soap is an antiseptic cleanser which kills germs and bonds with the skin to continue killing germs even after washing.  Please do not use if you have an allergy to CHG or antibacterial soaps. If your skin becomes reddened/irritated stop using the CHG.  1. Shower the NIGHT BEFORE SURGERY and the MORNING OF SURGERY with CHG soap.  2. If you choose to wash your hair, wash your hair first as usual with your normal shampoo.  3. After shampooing, rinse your  hair and body thoroughly to remove the shampoo.  4. Use CHG as you would any other liquid soap. You can apply CHG directly to the skin and wash gently with a scrungie or a clean washcloth.  5. Apply the CHG soap to your body only from the neck down. Do not use on open wounds or open sores. Avoid contact with your eyes, ears, mouth, and genitals (private parts). Wash face and genitals (private parts) with your normal soap.  6. Wash thoroughly, paying special attention to the area where your surgery will be performed.  7. Thoroughly rinse your body with warm water.  8. Do not shower/wash with your normal soap after using and rinsing off the CHG soap.  9. Pat yourself dry with a clean towel.  10. Wear clean pajamas to bed the night before surgery.  12. Place clean sheets on your bed the night of your first shower and do not sleep with pets.  13. Shower again with the CHG soap on the day of surgery prior to arriving at the hospital.  14. Do not apply any deodorants/lotions/powders.  15. Please  wear clean clothes to the hospital.  How to Use an Incentive Spirometer An incentive spirometer is a tool that measures how well you are filling your lungs with each breath. Learning to take long, deep breaths using this tool can help you keep your lungs clear and active. This may help to reverse or lessen your chance of developing breathing (pulmonary) problems, especially infection. You may be asked to use a spirometer: After a surgery. If you have a lung problem or a history of smoking. After a long period of time when you have been unable to move or be active. If the spirometer includes an indicator to show the highest number that you have reached, your health care provider or respiratory therapist will help you set a goal. Keep a log of your progress as told by your health care provider. What are the risks? Breathing too quickly may cause dizziness or cause you to pass out. Take your time so you  do not get dizzy or light-headed. If you are in pain, you may need to take pain medicine before doing incentive spirometry. It is harder to take a deep breath if you are having pain. How to use your incentive spirometer  Sit up on the edge of your bed or on a chair. Hold the incentive spirometer so that it is in an upright position. Before you use the spirometer, breathe out normally. Place the mouthpiece in your mouth. Make sure your lips are closed tightly around it. Breathe in slowly and as deeply as you can through your mouth, causing the piston or the ball to rise toward the top of the chamber. Hold your breath for 3-5 seconds, or for as long as possible. If the spirometer includes a coach indicator, use this to guide you in breathing. Slow down your breathing if the indicator goes above the marked areas. Remove the mouthpiece from your mouth and breathe out normally. The piston or ball will return to the bottom of the chamber. Rest for a few seconds, then repeat the steps 10 or more times. Take your time and take a few normal breaths between deep breaths so that you do not get dizzy or light-headed. Do this every 1-2 hours when you are awake. If the spirometer includes a goal marker to show the highest number you have reached (best effort), use this as a goal to work toward during each repetition. After each set of 10 deep breaths, cough a few times. This will help to make sure that your lungs are clear. If you have an incision on your chest or abdomen from surgery, place a pillow or a rolled-up towel firmly against the incision when you cough. This can help to reduce pain while taking deep breaths and coughing. General tips When you are able to get out of bed: Walk around often. Continue to take deep breaths and cough in order to clear your lungs. Keep using the incentive spirometer until your health care provider says it is okay to stop using it. If you have been in the hospital, you  may be told to keep using the spirometer at home. Contact a health care provider if: You are having difficulty using the spirometer. You have trouble using the spirometer as often as instructed. Your pain medicine is not giving enough relief for you to use the spirometer as told. You have a fever. Get help right away if: You develop shortness of breath. You develop a cough with bloody mucus from the lungs. You  have fluid or blood coming from an incision site after you cough. Summary An incentive spirometer is a tool that can help you learn to take long, deep breaths to keep your lungs clear and active. You may be asked to use a spirometer after a surgery, if you have a lung problem or a history of smoking, or if you have been inactive for a long period of time. Use your incentive spirometer as instructed every 1-2 hours while you are awake. If you have an incision on your chest or abdomen, place a pillow or a rolled-up towel firmly against your incision when you cough. This will help to reduce pain. Get help right away if you have shortness of breath, you cough up bloody mucus, or blood comes from your incision when you cough. This information is not intended to replace advice given to you by your health care provider. Make sure you discuss any questions you have with your health care provider. Document Revised: 06/16/2023 Document Reviewed: 06/16/2023 Elsevier Patient Education  2024 [employer].

## 2023-09-27 ENCOUNTER — Encounter: Payer: Self-pay | Admitting: Nurse Practitioner

## 2023-09-27 ENCOUNTER — Ambulatory Visit: Payer: Medicaid Other | Admitting: Nurse Practitioner

## 2023-09-27 ENCOUNTER — Inpatient Hospital Stay: Admission: RE | Admit: 2023-09-27 | Payer: Medicaid Other | Source: Ambulatory Visit

## 2023-09-27 VITALS — BP 105/58 | HR 71 | Temp 98.3°F | Ht 61.0 in | Wt 209.6 lb

## 2023-09-27 DIAGNOSIS — Z3009 Encounter for other general counseling and advice on contraception: Secondary | ICD-10-CM | POA: Diagnosis not present

## 2023-09-27 LAB — HEMOGLOBIN, FINGERSTICK: Hemoglobin: 13.8 g/dL (ref 11.1–15.9)

## 2023-09-27 LAB — PREGNANCY, URINE: Preg Test, Ur: NEGATIVE

## 2023-09-27 NOTE — Progress Notes (Signed)
 [facility] [address] [place] [address] Main phone: [phone]  Family Planning Visit - Repeat Yearly Visit  Subjective:  Lauren Stephens is a 33 y.o. [phone]  being seen today for an annual wellness visit and to discuss contraception options. The patient is currently using no method -    for pregnancy prevention. Patient does not want a pregnancy in the next year.   Patient reports they are looking for a method with the following characteristics:  High efficacy at preventing pregnancy  Patient has the following medical problems:  Patient Active Problem List   Diagnosis Date Noted   Sciatica 05/23/2023   Rubella non-immune status, antepartum 02/14/2023   Ovarian cyst during pregnancy 02/07/2023   Morbid obesity (HCC) BMI=42.3 08/01/2019   Insulin resistance 02/05/2018    Chief Complaint  Patient presents with   Postpartum Care    7.0 weeks   Contraception    HPI Patient reports feeling well today and doing well since delivery. She states baby is doing well and pediatrician is happy with how baby is progressing. She states baby has almost doubled his weight and is now around 8lbs. Of note, patient did go into pre-term labor at [redacted]w[redacted]d and had a c-section. PP appt completed in January by [facility]. Surgical incision checked then. Reports it is healing well and declines reassessment today.   Review of Systems  Constitutional:        Weight loss since delivery 08/09/23. Expected.   All other systems reviewed and are negative.  See flowsheet for other program required questions.   Diabetes screening This patient is 33 y.o. with a BMI of Body mass index is 39.6 kg/m.SABRA  Is patient eligible for diabetes screening (age >35 and BMI >25)?  no  Was Hgb A1c ordered? not applicable  Cervical Cancer Screening  Result Date Procedure Results Follow-ups  07/29/2020 IGP, rfx Aptima HPV ASCU  DIAGNOSIS:: Comment Specimen adequacy:: Comment Clinician Provided ICD10: Comment Performed by:: Comment PAP Smear Comment: . Note:: Comment Test Methodology: Comment PAP Reflex: Comment   11/17/2016 HM PAP SMEAR HM Pap smear: Negative     Health Maintenance Due  Topic Date Due   COVID-19 Vaccine (1 - 2024-25 season) Never done   Cervical Cancer Screening (HPV/Pap Cotest)  07/30/2023    The following portions of the patient's history were reviewed and updated as appropriate: allergies, current medications, past family history, past medical history, past social history, past surgical history and problem list. Problem list updated.  Objective:   Vitals:   09/27/23 1311  BP: (!) 105/58  Pulse: 71  Temp: 98.3 F (36.8 C)  Weight: 209 lb 9.6 oz (95.1 kg)  Height: 5' 1 (1.549 m)    Physical Exam Vitals and nursing note reviewed.  Constitutional:      Appearance: Normal appearance.  HENT:     Mouth/Throat:     Lips: Pink. No lesions.  Eyes:     General:        Right eye: No discharge.        Left eye: No discharge.  Pulmonary:     Effort: Pulmonary effort is normal.  Abdominal:     Comments: Pt declined c-section surgical wound check.   Genitourinary:    Comments: No genital exam. Patient with no concerns and declined.  Neurological:     Mental Status: She is alert and oriented to person, place, and time.  Psychiatric:  Attention and Perception: Attention and perception normal.        Mood and Affect: Mood and affect normal.        Speech: Speech normal.        Behavior: Behavior normal. Behavior is cooperative.        Thought Content: Thought content normal.    Assessment and Plan:  Lauren Stephens is a 33 y.o. female [phone] presenting to the [facility] Department for an yearly wellness and contraception visit  Contraception counseling: Reviewed options based on patient desire and reproductive life plan. Patient is interested in  Hormonal Implant. This was not provided to the patient today d/t inability to be reasonably certain patient is not pregnancy based on CDC guidelines. Patient last had unprotected sexual intercourse on 09/21/23 (6 days prior). Patient recently pregnant and delivered about 8 weeks ago. She has possibly started having a period as of yesterday with spotting since stopping bleeding PP about 4 weeks ago. Patient is not breastfeeding more than 25% of the time and supplements heavily with formula.   Risks, benefits, and typical effectiveness rates were reviewed.  Questions were answered.  Written information was also given to the patient to review.    The patient will follow up in  2 weeks for hormonal implant insertion if not pregnant on UPT in 2 weeks. Patient advised to take UPT at home. The patient was told to call with any further questions, or with any concerns about this method of contraception. Strongly encouraged patient to remain abstinent for the net 2 weeks until return visit to be sure she is not pregnant. Emphasized use of condoms 100% of the time for STI prevention. Patient verbalized understanding and agreed with plan.  Educated on ECP and assessed need for ECP. Patient was NOT offered ECP based on NO unprotected sex within 5 days. Last unprotected sex 6 days and ECP contraindicated.  1. Family planning (Primary) Checked hgb today as record of it cannot be located from [facility] visit. Hgb = 13.8. No intervention needed.  - Hemoglobin, fingerstick - Pregnancy, urine  Contraception counseling: Reviewed options based on patient desire and reproductive life plan. Patient is interested in Hormonal Implant. This was not provided to the patient today d/t inability to be reasonably certain patient is not pregnancy based on CDC guidelines. Patient last had unprotected sexual intercourse on 09/21/23 (6 days prior). Patient recently pregnant and delivered about 8 weeks ago. She has possibly started having a period  as of yesterday with spotting since stopping bleeding PP about 4 weeks ago. Patient is not breastfeeding more than 25% of the time and supplements heavily with formula.   Risks, benefits, and typical effectiveness rates were reviewed.  Questions were answered.  Written information was also given to the patient to review.    The patient will follow up in  2 weeks for hormonal implant insertion if not pregnant on UPT in 2 weeks. Patient advised to take UPT at home. The patient was told to call with any further questions, or with any concerns about this method of contraception. Strongly encouraged patient to remain abstinent for the net 2 weeks until return visit to be sure she is not pregnant. Emphasized use of condoms 100% of the time for STI prevention. Patient verbalized understanding and agreed with plan.  Educated on ECP and assessed need for ECP. Patient was NOT offered ECP based on NO unprotected sex within 5 days. Last unprotected sex 6 days and ECP contraindicated.  2. Encounter for counseling regarding contraception  UPT obtained in office today and was negative. Patient agreed to take a UPT at home in 2 weeks and if negative will return for hormonal implant. Patient verbalized understanding and agreed that she should abstain from sex for the next 2 weeks and if not abstaining must use condoms every time.  Return in about 2 weeks (around 10/11/2023) for Hormonal implant insertion.  Future Appointments  Date Time Provider Department Center  10/11/2023 11:00 AM [facility] PROVIDER [facility] None    Clarita LITTIE Narrow, NP

## 2023-09-27 NOTE — Progress Notes (Signed)
 Here today for PP birth control. C-Section delivery 08/09/2023 @ 33.1 weeks. Female 4.52 pounds. Last pap Smear was 09/21/2023 @ [facility] (NILM, HPV-.) Had complete PP appt at [facility] 09/21/2023 and scheduled BTL but has since cancelled per husbands request. Wants Nexplanon . Niels Devonshire, RN Hgb and PT results reviewed while in clinic by J. Idol, FNP. Per verbal order by provider patient scheduled for 2 week Nexplanon  Insertion. Niels Devonshire, RN

## 2023-09-29 ENCOUNTER — Ambulatory Visit
Admission: RE | Admit: 2023-09-29 | Payer: Medicaid Other | Source: Home / Self Care | Admitting: Obstetrics and Gynecology

## 2023-09-29 ENCOUNTER — Encounter: Admission: RE | Payer: Self-pay | Source: Home / Self Care

## 2023-09-29 DIAGNOSIS — Z01818 Encounter for other preprocedural examination: Secondary | ICD-10-CM

## 2023-09-29 SURGERY — LAPAROSCOPIC TUBAL LIGATION
Anesthesia: General | Laterality: Bilateral

## 2023-10-11 ENCOUNTER — Ambulatory Visit: Payer: Medicaid Other | Admitting: Nurse Practitioner

## 2023-10-11 VITALS — BP 114/80 | HR 73 | Ht 60.0 in | Wt 211.0 lb

## 2023-10-11 DIAGNOSIS — Z30017 Encounter for initial prescription of implantable subdermal contraceptive: Secondary | ICD-10-CM | POA: Diagnosis not present

## 2023-10-11 DIAGNOSIS — Z3202 Encounter for pregnancy test, result negative: Secondary | ICD-10-CM

## 2023-10-11 DIAGNOSIS — Z3009 Encounter for other general counseling and advice on contraception: Secondary | ICD-10-CM

## 2023-10-11 DIAGNOSIS — Z309 Encounter for contraceptive management, unspecified: Secondary | ICD-10-CM | POA: Diagnosis not present

## 2023-10-11 LAB — PREGNANCY, URINE: Preg Test, Ur: NEGATIVE

## 2023-10-11 MED ORDER — ETONOGESTREL 68 MG ~~LOC~~ IMPL
68.0000 mg | DRUG_IMPLANT | Freq: Once | SUBCUTANEOUS | Status: AC
  Administered 2023-10-11: 68 mg via SUBCUTANEOUS

## 2023-10-11 NOTE — Progress Notes (Signed)
Pt is here today for nexplanon insertion. Nexplanon counseling given to pt and inserted successfully at the [facility] by J. Idol, NP. Pt tolerated well to the procedure. Education given to patient on how this method works, how to care for the arm and report to the clinic any complications post-procedure. Pt given the opportunity to ask questions for any clarification. Sonda Primes, RN.

## 2023-10-11 NOTE — Progress Notes (Signed)
[employer] HEALTH DEPARTMENT [facility] [address] [place] [address] Main phone: [phone]  Family Planning Visit - Repeat Yearly Visit  Subjective:  Lauren Stephens is a 33 y.o. [phone]  being seen today for an annual wellness visit and to discuss contraception options. The patient is currently using abstinence for pregnancy prevention. Patient does not want a pregnancy in the next year.   Patient reports they are looking for a method with the following characteristics:  High efficacy at preventing pregnancy  Patient has the following medical problems:  Patient Active Problem List   Diagnosis Date Noted   Sciatica 05/23/2023   Rubella non-immune status, antepartum 02/14/2023   Ovarian cyst during pregnancy 02/07/2023   Morbid obesity (HCC) BMI=42.3 08/01/2019   Insulin resistance 02/05/2018    Chief Complaint  Patient presents with   Contraception    Nex insert    HPI Patient reports to clinic today for Nexplanon insertion only. Patient was last seen in this clinic for a PP visit on 09/27/23. At that time she wished to have Nexplanon inserted; however, she had been sexually active without a condom 6 days prior to that visit. UPT in office that day was negative, but pregnancy could not be ruled out using CDC guidelines for reasonably sure pt not pregnant. Therefore, appt was scheduled for today for Nexplanon insertion once we were sure pt not pregnant.  At this time, pt reports last sex was 09/21/23. Last period started 09/26/23.   Review of Systems  Constitutional:  Positive for weight loss.       Expected during PP period.   All other systems reviewed and are negative.   See flowsheet for other program required questions.   Diabetes screening This patient is 33 y.o. with a BMI of Body mass index is 41.21 kg/m.Marland Kitchen  Is patient eligible for diabetes screening (age >35 and BMI >25)?  no  Was Hgb A1c ordered? not  applicable  STI screening Patient reports 1 of partners in last year.  Does this patient desire STI screening?  No - Recently screened  Hepatitis C screening Has patient been screened once for HCV in the past?  No  No results found for: "HCVAB"  Does the patient meet criteria for HCV testing? No  (If yes-- Screen for HCV through [facility]) Criteria:  Since the last HCV result, does the patient have any of the following? - Current drug use - Have a partner with drug use - Has been incarcerated  Hepatitis B screening Does the patient meet criteria for HBV testing? No Criteria:  -Household, sexual or needle sharing contact with HBV -History of drug use -HIV positive -Those with known Hep C  Cervical Cancer Screening  Result Date Procedure Results Follow-ups  07/29/2020 IGP, rfx Aptima HPV ASCU DIAGNOSIS:: Comment Specimen adequacy:: Comment Clinician Provided ICD10: Comment Performed by:: Comment PAP Smear Comment: . Note:: Comment Test Methodology: Comment PAP Reflex: Comment   11/17/2016 HM PAP SMEAR HM Pap smear: Negative     Health Maintenance Due  Topic Date Due   COVID-19 Vaccine (1 - 2024-25 season) Never done   Cervical Cancer Screening (HPV/Pap Cotest)  07/30/2023    The following portions of the patient's history were reviewed and updated as appropriate: allergies, current medications, past family history, past medical history, past social history, past surgical history and problem list. Problem list updated.  Objective:   Vitals:   10/11/23 0847  BP: 114/80  Pulse: 73  Weight: 211 lb (95.7 kg)  Height: 5' (1.524 m)    Physical Exam Vitals and nursing note reviewed. Exam conducted with a chaperone present (Destiny, RN present as chaperone.).  Constitutional:      Appearance: Normal appearance. She is well-groomed.  HENT:     Mouth/Throat:     Lips: Pink.  Eyes:     General:        Right eye: No discharge.        Left eye: No discharge.      Conjunctiva/sclera:     Right eye: Right conjunctiva is not injected.     Left eye: Left conjunctiva is not injected.  Pulmonary:     Effort: Pulmonary effort is normal.  Genitourinary:    Comments: Genital exam declined. Pt declined STI testing and has no concerns today.  Skin:    General: Skin is warm and dry.     Comments: Exposed areas only. Skin tone appropriate for ethnicity.   Neurological:     Mental Status: She is alert and oriented to person, place, and time.  Psychiatric:        Attention and Perception: Attention and perception normal.        Mood and Affect: Mood and affect normal.        Speech: Speech normal.        Behavior: Behavior normal. Behavior is cooperative.        Thought Content: Thought content normal.     Assessment and Plan:  Lauren Stephens is a 33 y.o. female [phone] presenting to the [facility] Department for an yearly wellness and contraception visit  1. Family planning (Primary) Contraception counseling: Reviewed options based on patient desire and reproductive life plan. Patient is interested in Hormonal Implant. This was provided to the patient today.   Risks, benefits, and typical effectiveness rates were reviewed.  Questions were answered.  Written information was also given to the patient to review.    The patient will follow up in  1 years for surveillance.  The patient was told to call with any further questions, or with any concerns about this method of contraception.  Emphasized use of condoms 100% of the time for STI prevention.  Educated on ECP and assessed need for ECP. Patient was NOT offered ECP based on no unprotected sex.   2. Nexplanon insertion Nexplanon Insertion Procedure Patient identified, informed consent performed, consent signed.   Patient does understand that irregular bleeding is a very common side effect of this medication. She was advised to have backup contraception after placement. Patient was  determined to meet WHO criteria for not being pregnant. Appropriate time out taken.  The insertion site was identified 8-10 cm (3-4 inches) from the medial epicondyle of the humerus and 3-5 cm (1.25-2 inches) posterior to (below) the sulcus (groove) between the biceps and triceps muscles of the patient's left arm and marked.  Patient was prepped with alcohol swab and then injected with 3 ml of 1% lidocaine.  Arm was prepped with chlorhexidene, Nexplanon removed from packaging,  Device confirmed in needle, then inserted full length of needle and withdrawn per handbook instructions. Nexplanon was able to palpated in the patient's arm; patient palpated the insert herself. There was minimal blood loss.  Patient insertion site covered with guaze and a pressure bandage to reduce any bruising.  The patient tolerated the procedure well and was given post procedure instructions.    Counseled patient to take  OTC analgesic starting as soon as lidocaine starts to wear off and take regularly for at least 48 hr to decrease discomfort.  Specifically to take with food or milk to decrease stomach upset and for IB 600 mg (3 tablets) every 6 hrs; IB 800 mg (4 tablets) every 8 hrs; or Aleve 2 tablets every 12 hrs.   - Pregnancy, urine - etonogestrel (NEXPLANON) implant 68 mg   Return in about 1 year (around 10/10/2024) for annual well-woman exam.  No future appointments.  Edmonia James, NP

## 2024-01-11 ENCOUNTER — Telehealth: Payer: Self-pay | Admitting: Family Medicine

## 2024-01-11 NOTE — Telephone Encounter (Signed)
 Patient called in and stated she got the nexplanon  and has now been having her cycle for 2 months and would like advice on how to solve this problem

## 2024-01-18 ENCOUNTER — Telehealth: Payer: Self-pay | Admitting: Family Medicine

## 2024-01-19 NOTE — Telephone Encounter (Signed)
 Called and scheduled pt for acute visit, bleeding with nexplanon .

## 2024-01-24 ENCOUNTER — Encounter: Payer: Self-pay | Admitting: Nurse Practitioner

## 2024-01-24 ENCOUNTER — Ambulatory Visit: Admitting: Family Medicine

## 2024-01-24 VITALS — BP 115/78 | HR 57 | Ht 61.0 in | Wt 212.4 lb

## 2024-01-24 DIAGNOSIS — N939 Abnormal uterine and vaginal bleeding, unspecified: Secondary | ICD-10-CM

## 2024-01-24 DIAGNOSIS — R21 Rash and other nonspecific skin eruption: Secondary | ICD-10-CM

## 2024-01-24 DIAGNOSIS — Z309 Encounter for contraceptive management, unspecified: Secondary | ICD-10-CM | POA: Diagnosis not present

## 2024-01-24 DIAGNOSIS — Z30011 Encounter for initial prescription of contraceptive pills: Secondary | ICD-10-CM | POA: Diagnosis not present

## 2024-01-24 MED ORDER — TRIAMCINOLONE ACETONIDE 0.1 % EX CREA: 1.0000 | TOPICAL_CREAM | Freq: Two times a day (BID) | CUTANEOUS | 0 refills | Status: AC

## 2024-01-24 MED ORDER — NORGESTIMATE-ETH ESTRADIOL 0.25-35 MG-MCG PO TABS: 1.0000 | ORAL_TABLET | Freq: Every day | ORAL | 2 refills | Status: DC

## 2024-01-24 MED ORDER — TRIAMCINOLONE ACETONIDE 0.1 % EX CREA: 1.0000 | TOPICAL_CREAM | Freq: Two times a day (BID) | CUTANEOUS | 0 refills | Status: DC

## 2024-01-24 NOTE — Progress Notes (Addendum)
   [facility] Problem Visit  [facility]  Subjective:  Lauren Stephens is a 33 y.o. being seen today for   Chief Complaint  Patient presents with   Acute Visit    Pt is here for an acute visit and report bleeding 11/2023    HPI  Pt received a nexplanon  in Feb 2025. Had nexplanon  2 other times after delivery of her other sons- reports that then she experienced irregular bleeding. Has been bleeding daily since April. Called clinic last month and tried high dose ibuprofen  regimen- but states this actually increased her bleeding. Desires to keep Nexplanon  as a BCM and wants to see what else can be done.   Health Maintenance Due  Topic Date Due   COVID-19 Vaccine (1 - 2024-25 season) Never done   Cervical Cancer Screening (HPV/Pap Cotest)  07/30/2023    ROS  The following portions of the patient's history were reviewed and updated as appropriate: allergies, current medications, past family history, past medical history, past social history, past surgical history and problem list. Problem list updated.   See flowsheet for other program required questions.  Objective:   Vitals:   01/24/24 1125  BP: 115/78  Pulse: (!) 57  Weight: 212 lb 6.4 oz (96.3 kg)  Height: 5\' 1"  (1.549 m)    Physical Exam Skin:    Findings: Erythema and rash present.            Assessment and Plan:  Lauren Stephens is a 33 y.o. female presenting to the [facility] Department for a Women's Health problem visit  1. Vaginal bleeding (Primary) -trial of 3 months of Sprintec to see if we can gain control of vaginal bleeding -counseled to RTC if the bleeding does not stop after 3 months -pt is NOT breastfeeding, does not smoke, and BP is normotensive today  - norgestimate-ethinyl estradiol (SPRINTEC 28) 0.25-35 MG-MCG tablet; Take 1 tablet by mouth daily.  Dispense: 28 tablet; Refill: 2  2. Rash - itchy rash present on patients  right forearm -4-5 round large circles (about quarter size)- crusty/scaling -possibly psoriasis vs eczema? -pt reports she has tried, lotions, and ring worm cream but cannot get rid of it -uninsured and does not have PCP -counseled I will order 1 x triamcinaolone, but if this does not help needs to see a PCP  - triamcinolone cream (KENALOG) 0.1 %; Apply 1 Application topically 2 (two) times daily.  Dispense: 30 g; Refill: 0     Return in about 3 months (around 04/25/2024).  No future appointments.  Earleen Glazier, [place]

## 2024-01-24 NOTE — Progress Notes (Signed)
 Pt is here for an acute visit. The patient was dispensed Sprintec/Milli. I provided counseling today regarding the medication. We discussed the medication, the side effects and when to call clinic. Condoms declined. Patient given the opportunity to ask questions for any clarification. Austine Lefort, RN.

## 2024-02-28 ENCOUNTER — Ambulatory Visit: Admitting: Family Medicine

## 2024-02-28 ENCOUNTER — Encounter: Payer: Self-pay | Admitting: Family Medicine

## 2024-02-28 VITALS — BP 120/79 | HR 53 | Ht 60.0 in | Wt 209.4 lb

## 2024-02-28 DIAGNOSIS — Z309 Encounter for contraceptive management, unspecified: Secondary | ICD-10-CM

## 2024-02-28 DIAGNOSIS — Z30011 Encounter for initial prescription of contraceptive pills: Secondary | ICD-10-CM | POA: Insufficient documentation

## 2024-02-28 DIAGNOSIS — Z3046 Encounter for surveillance of implantable subdermal contraceptive: Secondary | ICD-10-CM | POA: Diagnosis not present

## 2024-02-28 DIAGNOSIS — N939 Abnormal uterine and vaginal bleeding, unspecified: Secondary | ICD-10-CM

## 2024-02-28 MED ORDER — NORGESTIMATE-ETH ESTRADIOL 0.25-35 MG-MCG PO TABS: 1.0000 | ORAL_TABLET | Freq: Every day | ORAL | Status: DC

## 2024-02-28 NOTE — Progress Notes (Signed)
 [employer] HEALTH DEPARTMENT [facility] [address] [place] [address] Main phone: [phone]  Family Planning Visit - Repeat Yearly Visit  Subjective:  Lauren Stephens is a 33 y.o. [phone]  being seen today for an annual wellness visit and to discuss contraception options. The patient is currently using hormonal implant for pregnancy prevention. Patient does not want a pregnancy in the next year.   Patient reports they are looking for a method with the following characteristics:  High efficacy at preventing pregnancy  Patient has the following medical problems:  Patient Active Problem List   Diagnosis Date Noted   Encounter for initial prescription of contraceptive pills 02/28/2024   Nexplanon  removal 02/28/2024   Sciatica 05/23/2023   Rubella non-immune status, antepartum 02/14/2023   Ovarian cyst during pregnancy 02/07/2023   Morbid obesity (HCC) BMI=42.3 08/01/2019   Insulin resistance 02/05/2018   Chief Complaint  Patient presents with   Acute Visit   HPI Patient reports she would like her Nexplanon  removed. Placed 10/11/2023 at [facility], she reports non-stop menstrual bleeding since. 4 months of spotting, but last 2 months of normal volume menstrual bleeding. S/p ibuprofen /NSAIDs and Sprintec x1 month without relief of abnormal bleeding.   No history of HTN, DVT, PE, stroke, migraine with aura, congenital clotting disorders, or smoking.  Review of Systems  Constitutional:  Negative for fever, malaise/fatigue and weight loss.  Respiratory:  Negative for shortness of breath.   Cardiovascular:  Negative for chest pain and palpitations.   See flowsheet for further details and programmatic requirements Hyperlink available at the top of the signed note in blue.  Flow sheet content below:  Pregnancy Intention Screening Does the patient want to become pregnant in the next year?: No Does the patient's partner want to become  pregnant in the next year?: No Would the patient like to discuss contraceptive options today?: Yes Contraception Wrap Up Current Method: Hormonal Implant End Method: Oral Contraceptive Contraception Counseling Provided: Yes How was the end contraceptive method provided?: Provided on site  Diabetes screening This patient is 33 y.o. with a BMI of Body mass index is 40.9 kg/m. Is patient eligible for diabetes screening (age >35 and BMI >25)?  no  Was Hgb A1c ordered? not applicable  STI screening Patient reports 1 of partners in last year.  Does this patient desire STI screening?  No - declines  Hepatitis C screening Has patient been screened once for HCV in the past?  No  No results found for: HCVAB  Does the patient meet criteria for HCV testing? No   Hepatitis B screening Does the patient meet criteria for HBV testing? No  Cervical Cancer Screening  Result Date Procedure Results Follow-ups  07/29/2020 IGP, rfx Aptima HPV ASCU DIAGNOSIS:: Comment Specimen adequacy:: Comment Clinician Provided ICD10: Comment Performed by:: Comment PAP Smear Comment: . Note:: Comment Test Methodology: Comment PAP Reflex: Comment   11/17/2016 HM PAP SMEAR HM Pap smear: Negative    Health Maintenance Due  Topic Date Due   Hepatitis B Vaccines (1 of 3 - 19+ 3-dose series) Never done   HPV VACCINES (1 - 3-dose SCDM series) Never done   COVID-19 Vaccine (1 - 2024-25 season) Never done   Cervical Cancer Screening (HPV/Pap Cotest)  07/30/2023   The following portions of the patient's history were reviewed and updated as appropriate: allergies, current medications, past family history, past medical history, past social history, past surgical history and problem list. Problem list updated.  Objective:   Vitals:   02/28/24 1504  BP: 120/79  Pulse: (!) 53  Weight: 209 lb 6.4 oz (95 kg)  Height: 5' (1.524 m)   Physical Exam Vitals and nursing note reviewed.  Constitutional:       Appearance: Normal appearance.  HENT:     Head: Normocephalic.     Mouth/Throat:     Mouth: Mucous membranes are moist.  Eyes:     General: No scleral icterus.       Right eye: No discharge.        Left eye: No discharge.  Cardiovascular:     Rate and Rhythm: Normal rate.  Pulmonary:     Effort: Pulmonary effort is normal.  Abdominal:     Palpations: Abdomen is soft.  Genitourinary:    Comments: Declined genital exam Musculoskeletal:        General: Normal range of motion.     Cervical back: Neck supple. No rigidity or tenderness.  Lymphadenopathy:     Head:     Right side of head: No submandibular, preauricular or posterior auricular adenopathy.     Left side of head: No submandibular, preauricular or posterior auricular adenopathy.     Cervical: No cervical adenopathy.     Upper Body:     Right upper body: No supraclavicular adenopathy.     Left upper body: No supraclavicular adenopathy.  Skin:    General: Skin is warm and dry.     Capillary Refill: Capillary refill takes less than 2 seconds.     Coloration: Skin is not jaundiced or pale.     Findings: No bruising, erythema, lesion or rash.  Neurological:     General: No focal deficit present.     Mental Status: She is alert and oriented to person, place, and time.  Psychiatric:        Mood and Affect: Mood normal.        Behavior: Behavior normal.    Procedure:  Nexplanon  Removal Patient identified, informed consent performed, consent signed.   Appropriate time out taken. Nexplanon  site identified in the patient's left arm.  Area prepped in usual sterile fashon. 3 ml of 1% lidocaine  with Epinephrine  was used to anesthetize the area at the distal end of the implant and along implant site. A small stab incision was made right beside the implant on the distal portion.  The Nexplanon  rod was grasped using hemostats and removed without difficulty.  There was minimal blood loss. There were no complications.  Steri-strips were  applied over the small incision.  A pressure bandage was applied to reduce any bruising.  The patient tolerated the procedure well and was given post procedure instructions.    Assessment and Plan:  Lauren Stephens is a 33 y.o. female [phone] presenting to the [facility] Department for an yearly wellness and contraception visit  Contraception counseling:  Reviewed options based on patient desire and reproductive life plan. Patient is interested in Oral Contraceptive. This was provided to the patient today.   Risks, benefits, and typical effectiveness rates were reviewed.  Questions were answered.  Written information was also given to the patient to review.    The patient will follow up in  3 months for surveillance.  The patient was told to call with any further questions, or with any concerns about this method of contraception.  Emphasized use of condoms 100% of the time for STI prevention.  Emergency Contraception Precautions (ECP): Patient assessed for need  of ECP. She is not a candidate based on LARC in place and unexpired .   Nexplanon  removal Assessment & Plan: Nexplanon  removed without issue. See procedure note for more.    Encounter for initial prescription of contraceptive pills Assessment & Plan: After discussion of alternative contraceptive methods, patient elects for COCs. No history of HTN, DVT, PE, stroke, migraine with aura, congenital clotting disorders, or smoking. Rx Sprintec x 3 months. Will return in 3 months should she desire to continue COCs.   Orders: -     Norgestimate -Eth Estradiol ; Take 1 tablet by mouth daily.  No follow-ups on file.  No future appointments.  Betsey CHRISTELLA Helling, MD

## 2024-02-28 NOTE — Progress Notes (Signed)
 Patient is here for family planning visit. Family planning education card given to patient. Nexplanon  successfully removed by JAYSON Helling, MD. #6 packs of Milli given to patient per order by JAYSON Helling, MD. I provided counseling today regarding the medication. We discussed the medication, the side effects and when to call clinic. Patient given the opportunity to ask questions. Questions answered.   Lauren CINDERELLA Shuck, RN

## 2024-03-06 NOTE — Assessment & Plan Note (Signed)
 Nexplanon  removed without issue. See procedure note for more.

## 2024-03-06 NOTE — Assessment & Plan Note (Signed)
 After discussion of alternative contraceptive methods, patient elects for COCs. No history of HTN, DVT, PE, stroke, migraine with aura, congenital clotting disorders, or smoking. Rx Sprintec x 3 months. Will return in 3 months should she desire to continue COCs.

## 2024-04-23 ENCOUNTER — Telehealth: Payer: Self-pay | Admitting: Family Medicine

## 2024-04-25 ENCOUNTER — Other Ambulatory Visit: Payer: Self-pay

## 2024-04-25 DIAGNOSIS — Z30011 Encounter for initial prescription of contraceptive pills: Secondary | ICD-10-CM

## 2024-04-25 MED ORDER — NORGESTIMATE-ETH ESTRADIOL 0.25-35 MG-MCG PO TABS: 1.0000 | ORAL_TABLET | Freq: Every day | ORAL | 11 refills | Status: AC

## 2024-04-25 NOTE — Progress Notes (Signed)
 Patient called needing more contraceptive pills. She was seen for a Nexplanon  removal/physical and switched to OCPs on 02/28/24. Reports to Destiny [facility] RN that she likes her birth control pills. Needs a refill today - needs to take her pill at 7pm. No issues or side effects.  Pills sent to preferred pharmacy:  1. Encounter for initial prescription of contraceptive pills  - norgestimate -ethinyl estradiol  (SPRINTEC 28) 0.25-35 MG-MCG tablet; Take 1 tablet by mouth daily.  Dispense: 28 tablet; Refill: 11   Patient told by RN to follow up in July, 2026 for her annual exam.  Damien Satchel NP

## 2024-04-26 NOTE — Telephone Encounter (Signed)
 Larraine JONELLE Northern, RN
